# Patient Record
Sex: Female | Born: 1975 | Hispanic: Yes | Marital: Single
Health system: Northeastern US, Academic
[De-identification: ages and names within clinical notes are randomized; demographics above are authoritative.]

## PROBLEM LIST (undated history)

## (undated) DIAGNOSIS — D45 Polycythemia vera: Secondary | ICD-10-CM

## (undated) DIAGNOSIS — I10 Essential (primary) hypertension: Secondary | ICD-10-CM

## (undated) DIAGNOSIS — T7421XA Adult sexual abuse, confirmed, initial encounter: Secondary | ICD-10-CM

## (undated) DIAGNOSIS — F419 Anxiety disorder, unspecified: Secondary | ICD-10-CM

## (undated) DIAGNOSIS — K5790 Diverticulosis of intestine, part unspecified, without perforation or abscess without bleeding: Secondary | ICD-10-CM

## (undated) DIAGNOSIS — T1491XA Suicide attempt, initial encounter: Secondary | ICD-10-CM

## (undated) DIAGNOSIS — F4310 Post-traumatic stress disorder, unspecified: Secondary | ICD-10-CM

## (undated) DIAGNOSIS — C801 Malignant (primary) neoplasm, unspecified: Secondary | ICD-10-CM

## (undated) DIAGNOSIS — M797 Fibromyalgia: Secondary | ICD-10-CM

## (undated) DIAGNOSIS — G629 Polyneuropathy, unspecified: Secondary | ICD-10-CM

## (undated) DIAGNOSIS — J45909 Unspecified asthma, uncomplicated: Secondary | ICD-10-CM

## (undated) DIAGNOSIS — G8929 Other chronic pain: Secondary | ICD-10-CM

## (undated) DIAGNOSIS — M5440 Lumbago with sciatica, unspecified side: Secondary | ICD-10-CM

## (undated) DIAGNOSIS — N390 Urinary tract infection, site not specified: Secondary | ICD-10-CM

## (undated) DIAGNOSIS — M5441 Lumbago with sciatica, right side: Secondary | ICD-10-CM

## (undated) HISTORY — PX: FOOT SURGERY: SHX648

## (undated) HISTORY — PX: COLON SURGERY: SHX602

## (undated) HISTORY — PX: SPINAL FUSION: SHX223

## (undated) HISTORY — PX: HEMORROIDECTOMY: SUR656

---

## 2009-05-06 NOTE — ED Provider Notes (Signed)
[facility]                      EMERGENCY DEPARTMENT TREATMENT REPORT   PRELIMINARY (DRAFT) -- FINAL REPORT  in HPF   NAME:  Sherri Elliott, Sherri Elliott            SEX:            F   DATE:  05/06/2009                     DOB:            March 01, 1976   MR#    [id number]                       TIME SEEN        1:48 P   ACCT#  [id number]                      ROOM:           ER  ER40   cc:   The primary care physician is in [place].   TIME OF MY EVALUATION:  13:01.   CHIEF COMPLAINT:  Back pain.   HISTORY OF PRESENT ILLNESS:  This is a 33 year old female with chronic back   pain due to arthritis and scoliosis with increasing discomfort today.  Has   taken 2 Vicodin and it has not been helpful.  The pain is across her lower   midback.  It does not radiate.  There is no incontinence.  Because of   increasing pain, comes in for evaluation.   REVIEW OF SYSTEMS:   CONSTITUTIONAL:  No fever, chills, or weight loss.   GASTROINTESTINAL:  No vomiting, diarrhea, or abdominal pain.   GENITOURINARY:  No dysuria, frequency, or urgency.   MUSCULOSKELETAL:  As above.   NEUROLOGICAL:  No headaches, sensory or motor symptoms.   PAST MEDICAL HISTORY:  Asthma, chronic low back pain, scoliosis, arthritis.   FAMILY HISTORY:  Noncontributory.   SOCIAL HISTORY:  Here alone.   ALLERGIES:  Ibuprofen, aspirin, latex.   MEDICATIONS:  Multiple and reviewed in Ibex.   PHYSICAL EXAMINATION:   GENERAL:  Well-developed well-nourished female.   VITAL SIGNS:  Blood pressure is 127/84, pulse 104, respirations 16,   temperature 98.8, O2 saturation on room air is 99%.  Pain is 10/10.   HEENT:  Mouth/Throat:  Surfaces of the pharynx, palate, and tongue are   pink, moist, and without lesions.   NECK:  Supple, nontender, symmetrical, no masses or JVD, trachea midline.   Thyroid not enlarged, nodular, or tender.  No cervical or submandibular   lymphadenopathy palpated.   ABDOMEN:  Soft, nontender.    BACK:  Tender LS spine and paravertebral muscles.   NEUROLOGIC:  Alert, oriented.  Sensation intact, motor strength equal and   symmetric.  The rest are all negative.   CONTINUATION BY:  Blanchard Mane, P.A.-C   INITIAL ASSESSMENT/MANAGEMENT PLAN:  The patient with chronic back pain, a   little worse today.  Does seem to be musculoskeletal.  I do not see   evidence of infections.  No fever.   No vomiting.  Will check urine looking   for infection and also pregnancy.   DIAGNOSTIC STUDIES:  Urine was dip negative x2.  There was small blood.   Pregnancy was negative.   FINAL DIAGNOSIS:  Acute exacerbation of chronic low back pain.  DISPOSITION:  The patient is discharged home in stable condition with   instructions to follow up with their regular doctor.  They are advised to   return immediately for any worsening or symptoms of concern.  Prescription   given for Percocet 5/325, #16.  Call your doctor tomorrow for followup.   May return if new or worsening symptoms.  Return to work on May 09, 2009.  The patient was personally evaluated by myself and Dr. Stormy Card, who agrees with the above assessment and plan.   ____________________________   Stormy Card, M.D.   Dictated By:  Blanchard Mane, PA   My signature above authenticates this document and my orders, the final   diagnosis(es), discharge prescription(s) and instructions in the [facility]   [facility] record.   st  D:  05/06/2009  T:  05/10/2009  8:30 A   [id number]   st  D:  05/06/2009  T:  05/10/2009  1:40 A   [id number]

## 2010-09-22 LAB — URINALYSIS W/ RFLX MICROSCOPIC
Bilirubin: NEGATIVE
Blood: NEGATIVE
Glucose: NEGATIVE MG/DL
Ketone: NEGATIVE MG/DL
Nitrites: NEGATIVE
Protein: NEGATIVE MG/DL
Specific gravity: <1.005 (ref 1.003–1.030)
Urobilinogen: 0.2 EU/DL (ref 0.2–1.0)
pH (UA): 6 (ref 5.0–8.0)

## 2010-09-22 LAB — CBC WITH AUTOMATED DIFF
ABS. BASOPHILS: 0.1 10*3/uL — ABNORMAL HIGH (ref 0.0–0.06)
ABS. EOSINOPHILS: 0.2 10*3/uL (ref 0.0–0.4)
ABS. LYMPHOCYTES: 3.1 10*3/uL (ref 0.9–3.6)
ABS. MONOCYTES: 0.7 10*3/uL (ref 0.05–1.2)
ABS. NEUTROPHILS: 6.3 10*3/uL (ref 1.8–8.0)
BASOPHILS: 1 % (ref 0–2)
EOSINOPHILS: 2 % (ref 0–5)
HCT: 43.5 % (ref 35.0–45.0)
HGB: 14.6 g/dL (ref 12.0–16.0)
LYMPHOCYTES: 30 % (ref 21–52)
MCH: 28.3 PG (ref 24.0–34.0)
MCHC: 33.6 g/dL (ref 31.0–37.0)
MCV: 84.3 FL (ref 74.0–97.0)
MONOCYTES: 7 % (ref 3–10)
MPV: 10.2 FL (ref 9.2–11.8)
NEUTROPHILS: 60 % (ref 40–73)
PLATELET: 361 10*3/uL (ref 135–420)
RBC: 5.16 M/uL (ref 4.20–5.30)
RDW: 14.6 % — ABNORMAL HIGH (ref 11.6–14.5)
WBC: 10.4 10*3/uL (ref 4.6–13.2)

## 2010-09-22 LAB — DRUG SCREEN, URINE
AMPHETAMINES: NEGATIVE
BARBITURATES: NEGATIVE
BENZODIAZEPINES: NEGATIVE
COCAINE: POSITIVE — AB
METHADONE: NEGATIVE
OPIATES: POSITIVE — AB
PCP(PHENCYCLIDINE): NEGATIVE
THC (TH-CANNABINOL): NEGATIVE

## 2010-09-22 LAB — URINE MICROSCOPIC ONLY
Bacteria: NEGATIVE /HPF
RBC: NEGATIVE /HPF (ref 0–5)
WBC: 0 /HPF (ref 0–4)

## 2010-09-22 LAB — HCG URINE, QL: HCG urine, QL: NEGATIVE

## 2011-03-23 LAB — URINALYSIS W/ RFLX MICROSCOPIC
Bilirubin: NEGATIVE
Blood: NEGATIVE
Glucose: NEGATIVE MG/DL
Ketone: NEGATIVE MG/DL
Leukocyte Esterase: NEGATIVE
Nitrites: NEGATIVE
Protein: NEGATIVE MG/DL
Specific gravity: 1.02 (ref 1.003–1.030)
Urobilinogen: 0.2 EU/DL (ref 0.2–1.0)
pH (UA): 7 (ref 5.0–8.0)

## 2011-03-23 LAB — HCG URINE, QL: HCG urine, QL: NEGATIVE

## 2011-04-25 LAB — HCG URINE, QL: HCG urine, QL: NEGATIVE
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
## 2011-05-07 NOTE — Procedures (Signed)
Test Reason : Chest pain   Blood Pressure : ***/*** mmHG   Vent. Rate : 100 BPM     Atrial Rate : 100 BPM      P-R Int : 146 ms          QRS Dur : 082 ms       QT Int : 376 ms       P-R-T Axes : 065 067 014 degrees      QTc Int : 485 ms   Normal sinus rhythm   Prolonged QT   Abnormal ECG   No previous ECGs available   Confirmed by Adela Glimpse D. ([id number]) on 05/08/2011 12:04:54 PM   Referred By:             Gay Filler By: Burgess Estelle
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
## 2011-05-07 NOTE — Procedures (Signed)
Test Reason : Chest pain   Blood Pressure : ***/*** mmHG   Vent. Rate : 100 BPM     Atrial Rate : 100 BPM      P-R Int : 146 ms          QRS Dur : 082 ms       QT Int : 376 ms       P-R-T Axes : 065 067 014 degrees      QTc Int : 485 ms   Normal sinus rhythm   Prolonged QT   Abnormal ECG   No previous ECGs available   Confirmed by McCray, Robert D. ([id number]) on 05/08/2011 12:04:54 PM   Referred By:             Overread By: Robert D. McCray

## 2011-08-24 MED ORDER — OXYCODONE-ACETAMINOPHEN 5 MG-325 MG TAB
5-325 mg | ORAL | Status: AC
Start: 2011-08-24 — End: 2011-08-24
  Administered 2011-08-24: via ORAL

## 2011-08-24 NOTE — ED Notes (Signed)
Patient states that she fell back on steps at home 3 days ago - states has taken Tylenol for the mid to lower back pain getting worse

## 2011-08-24 NOTE — ED Provider Notes (Signed)
HPI Comments: Patient states that she slipped coming down some steps three days ago, and landed on her back.  Since then she has been taking tylenol, with very little relief.  She states that the pain is getting so bad that she feels it in her buttocks, and it is a burning, numbness, sharp type of pain. She is not having any difficulty urinating or defecating because of it, it just hurts more when she has a bowel movement.  The pain doesn't extend down her legs.  No fecal/urinary incontinece, dysuria, frequency, urgency, or other symptoms or complaints.  She has been having a little bit of sneezing the last couple of days, but no SOB, difficulty breathing, cough, sore throat, dysuria, frequency, urgency, hematuria, or other symptoms or complaints.    Patient is a 36 y.o. female presenting with back pain. The history is provided by the patient.   Back Pain   This is a new problem. The current episode started more than 2 days ago. The problem has not changed since onset.The problem occurs constantly. Patient reports no work related injury.The pain is associated with a fall. The pain is present in the lumbar spine. The quality of the pain is described as stabbing, burning and sharp. Radiates to: buttocks. The pain is at a severity of 10/10. The pain is severe. The symptoms are aggravated by bending, twisting and certain positions. The pain is the same all the time. Stiffness is present all day. Associated symptoms include numbness, perianal numbness, paresthesias and tingling. Pertinent negatives include no chest pain, no fever, no weight loss, no headaches, no abdominal pain, no abdominal swelling, no bowel incontinence, no bladder incontinence, no dysuria, no pelvic pain, no leg pain, no paresis and no weakness. Treatments tried: Tylenol. The treatment provided mild relief. Risk factors include obesity, lack of exercise and a sedentary lifestyle.        Past Medical History   Diagnosis Date   ??? Asthma         Past  Surgical History   Procedure Date   ??? Hx hemorrhoidectomy    ??? Hx bunionectomy          No family history on file.     History     Social History   ??? Marital Status: MARRIED     Spouse Name: N/A     Number of Children: N/A   ??? Years of Education: N/A     Occupational History   ??? Not on file.     Social History Main Topics   ??? Smoking status: Current Everyday Smoker -- 0.1 packs/day   ??? Smokeless tobacco: Not on file   ??? Alcohol Use: No   ??? Drug Use: No   ??? Sexually Active:      Other Topics Concern   ??? Not on file     Social History Narrative   ??? No narrative on file                  ALLERGIES: Latex; Aspirin; Banana; Codeine; Motrin; Toradol; Tramadol; and Bee venom (honey bee)      Review of Systems   Constitutional: Negative for fever, chills, weight loss, activity change and appetite change.   HENT: Positive for sneezing. Negative for hearing loss, ear pain, nosebleeds, congestion, sore throat, facial swelling, rhinorrhea, drooling, mouth sores, trouble swallowing, neck pain, neck stiffness, dental problem, voice change, postnasal drip, sinus pressure, tinnitus and ear discharge.    Respiratory: Negative for apnea, cough,  choking, chest tightness, shortness of breath, wheezing and stridor.    Cardiovascular: Negative for chest pain.   Gastrointestinal: Negative for nausea, abdominal pain, diarrhea, blood in stool and bowel incontinence.   Genitourinary: Negative for bladder incontinence, dysuria, urgency, frequency, hematuria, flank pain, decreased urine volume, difficulty urinating and pelvic pain.   Musculoskeletal: Positive for back pain.   Skin: Negative for color change and rash.   Neurological: Positive for tingling, numbness and paresthesias. Negative for dizziness, weakness, light-headedness and headaches.   Hematological: Negative for adenopathy.       Filed Vitals:    08/24/11 1723   BP: 131/91   Pulse: 122   Temp: 99.1 ??F (37.3 ??C)   Resp: 17   SpO2: 100%            Physical Exam   Nursing note and  vitals reviewed.  Constitutional: She is oriented to person, place, and time. She appears well-developed and well-nourished.   HENT:   Head: Normocephalic and atraumatic.   Right Ear: External ear normal.   Left Ear: External ear normal.   Nose: Nose normal.   Mouth/Throat: Oropharynx is clear and moist. No oropharyngeal exudate.   Eyes: Conjunctivae and EOM are normal. Pupils are equal, round, and reactive to light. Right eye exhibits no discharge. Left eye exhibits no discharge.   Neck: Normal range of motion. Neck supple. No JVD present. No tracheal deviation present.   Cardiovascular: Normal rate, regular rhythm, normal heart sounds and intact distal pulses.  Exam reveals no gallop and no friction rub.    No murmur heard.  Pulmonary/Chest: Effort normal and breath sounds normal. No stridor. No respiratory distress. She has no wheezes. She has no rales.   Abdominal: Soft. Bowel sounds are normal. She exhibits no distension. There is no tenderness.   Musculoskeletal: She exhibits tenderness. She exhibits no edema.        L1-L5 is TTP with mild Paraspinous TTP bilatearlly.  No other back TTP.  Limited ROM to the lumbar spine due to increased pain with movement.  Neg SLR, toe/heel lift, axial loading. +Psuedo rotation, and +light touch.   Lymphadenopathy:     She has no cervical adenopathy.   Neurological: She is alert and oriented to person, place, and time. No cranial nerve deficit. She exhibits normal muscle tone.   Skin: Skin is warm and dry.   Psychiatric: She has a normal mood and affect. Her behavior is normal.        MDM     Differential Diagnosis; Clinical Impression; Plan:     Back pain, back strain, back contusion, vertebral fracture.  Amount and/or Complexity of Data Reviewed:   Tests in the radiology section of CPT??:  Ordered and reviewed   Discuss the patient with another provider:  Yes (Discussed care with Dr. Garnet Koyanagi.  I was concerned about elevated pulse.  He advised that if her pulse decreases when  her pain decreases then we can discharge her home, but to advise her to return if pain worsens, or if her heart starts to race. )  Risk of Significant Complications, Morbidity, and/or Mortality:   Presenting problems:  Low  Diagnostic procedures:  Low  Management options:  Low  General Comments: Mild pain improvement, pulse is improving also.  Advised patient to return if pain worsens.  Progress:   Patient progress:  Stable and improved      Procedures    7:47 PM  Reviewed treatment, medication, lab results, x-ray results, and  follow up plan with patient. Answered patient's questions.  Patient is ready for discharge.    A Lumbar spine series X-Ray was ordered. My reading of this film is No acute process, no fracture. (No comparison films available: pending review by Radiologist.)    Recent Results (from the past 12 hour(s))   URINALYSIS W/ RFLX MICROSCOPIC    Collection Time    08/24/11  6:45 PM       Component Value Range    Color AMBER      Appearance CLEAR      Specific gravity 1.015  1.003 - 1.030      pH 7.0  5.0 - 8.0      Protein NEGATIVE   NEGATIVE MG/DL    Glucose NEGATIVE   NEGATIVE MG/DL    Ketone NEGATIVE   NEGATIVE MG/DL    Bilirubin NEGATIVE   NEGATIVE    Blood NEGATIVE   NEGATIVE    Urobilinogen 0.2  0.2 - 1.0 EU/DL    Nitrites NEGATIVE   NEGATIVE    Leukocyte Esterase NEGATIVE   NEGATIVE     Diagnosis:   1. Low back pain          Disposition: Home    Follow-up Information     Follow up With Details Comments Contact Info    R Vivia Birmingham, MD in 2 weeks If no improvement [address]  [address]  [place] [address]  [phone]      [facility] EMERGENCY DEPT  If symptoms worsen [phone]          Patient's Medications   Start Taking    CYCLOBENZAPRINE (FLEXERIL) 10 MG TABLET    Take 1 Tab by mouth three (3) times daily as needed for Muscle Spasm(s).    OXYCODONE-ACETAMINOPHEN (PERCOCET) 5-325 MG PER TABLET    Take 1 Tab by mouth every four (4) hours as needed for Pain for 7 days.   Continue Taking     ALBUTEROL (PROVENTIL HFA, VENTOLIN HFA) 90 MCG/ACTUATION INHALER    Take 2 Puffs by inhalation every four (4) hours as needed.     These Medications have changed    No medications on file   Stop Taking    No medications on file

## 2011-08-24 NOTE — ED Provider Notes (Signed)
I was personally available for consultation in the emergency department.  I have not seen the patient but have reviewed the chart and agree with the documentation recorded by the MLP, including the assessment, treatment plan and disposition.  Mylz Yuan, MD

## 2011-08-25 LAB — URINALYSIS W/ RFLX MICROSCOPIC
Bilirubin: NEGATIVE
Blood: NEGATIVE
Glucose: NEGATIVE MG/DL
Ketone: NEGATIVE MG/DL
Leukocyte Esterase: NEGATIVE
Nitrites: NEGATIVE
Protein: NEGATIVE MG/DL
Specific gravity: 1.015 (ref 1.003–1.030)
Urobilinogen: 0.2 EU/DL (ref 0.2–1.0)
pH (UA): 7 (ref 5.0–8.0)

## 2011-08-25 MED ORDER — CYCLOBENZAPRINE 10 MG TAB
10 mg | ORAL | Status: AC
Start: 2011-08-25 — End: 2011-08-24
  Administered 2011-08-25: 02:00:00 via ORAL

## 2011-08-25 MED ORDER — CYCLOBENZAPRINE 10 MG TAB
10 mg | ORAL_TABLET | Freq: Three times a day (TID) | ORAL | Status: DC | PRN
Start: 2011-08-25 — End: 2012-07-08

## 2011-08-25 MED ORDER — OXYCODONE-ACETAMINOPHEN 5 MG-325 MG TAB
5-325 mg | ORAL_TABLET | ORAL | Status: AC | PRN
Start: 2011-08-25 — End: 2011-08-31

## 2011-08-25 MED FILL — OXYCODONE-ACETAMINOPHEN 5 MG-325 MG TAB: 5-325 mg | ORAL | Qty: 1
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
## 2011-09-08 NOTE — ED Provider Notes (Signed)
I was personally available for consultation in the emergency department.  I have reviewed the chart and agree with the documentation recorded by the MLP, including the assessment, treatment plan, and disposition.  Dusty Raczkowski J Benjermin Korber, MD

## 2011-09-08 NOTE — Other (Signed)
I have reviewed discharge instructions with the patient.  The patient verbalized understanding.

## 2011-12-17 NOTE — ED Notes (Signed)
Severe back pain for the last 2-3 days, pain is worst in lower back

## 2011-12-17 NOTE — ED Provider Notes (Signed)
I was personally available for consultation in the emergency department.  I have not seen the patient but have reviewed the chart prior to discharge and agree with the documentation recorded by the MLP, including the assessment, treatment plan, and disposition.  Shareka Casale, DO
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
## 2011-12-17 NOTE — ED Notes (Signed)
Patient left without dc instructions

## 2012-03-12 NOTE — ED Notes (Signed)
Patient with c/o lower abdominal cramping worsening over x 7 days.

## 2012-03-13 LAB — URINALYSIS W/ RFLX MICROSCOPIC
Bilirubin: NEGATIVE
Blood: NEGATIVE
Glucose: NEGATIVE MG/DL
Ketone: NEGATIVE MG/DL
Leukocyte Esterase: NEGATIVE
Nitrites: NEGATIVE
Protein: NEGATIVE MG/DL
Specific gravity: 1.025 (ref 1.003–1.030)
Urobilinogen: 1 EU/DL (ref 0.2–1.0)
pH (UA): 6.5 (ref 5.0–8.0)

## 2012-03-13 LAB — CBC WITH AUTOMATED DIFF
ABS. BASOPHILS: 0 10*3/uL (ref 0.0–0.06)
ABS. EOSINOPHILS: 0.3 10*3/uL (ref 0.0–0.4)
ABS. LYMPHOCYTES: 5.3 10*3/uL — ABNORMAL HIGH (ref 0.9–3.6)
ABS. MONOCYTES: 1.1 10*3/uL (ref 0.05–1.2)
ABS. NEUTROPHILS: 6.9 10*3/uL (ref 1.8–8.0)
BASOPHILS: 0 % (ref 0–2)
EOSINOPHILS: 2 % (ref 0–5)
HCT: 41.1 % (ref 35.0–45.0)
HGB: 14.1 g/dL (ref 12.0–16.0)
LYMPHOCYTES: 39 % (ref 21–52)
MCH: 27.9 PG (ref 24.0–34.0)
MCHC: 34.3 g/dL (ref 31.0–37.0)
MCV: 81.4 FL (ref 74.0–97.0)
MONOCYTES: 8 % (ref 3–10)
MPV: 9.8 FL (ref 9.2–11.8)
NEUTROPHILS: 51 % (ref 40–73)
PLATELET: 376 10*3/uL (ref 135–420)
RBC: 5.05 M/uL (ref 4.20–5.30)
RDW: 13.9 % (ref 11.6–14.5)
WBC: 13.6 10*3/uL — ABNORMAL HIGH (ref 4.6–13.2)

## 2012-03-13 LAB — METABOLIC PANEL, BASIC
Anion gap: 12 mmol/L (ref 3.0–18)
BUN/Creatinine ratio: 17 (ref 12–20)
BUN: 12 MG/DL (ref 7.0–18)
CO2: 24 MMOL/L (ref 21–32)
Calcium: 8.8 MG/DL (ref 8.5–10.1)
Chloride: 107 MMOL/L (ref 100–108)
Creatinine: 0.7 MG/DL (ref 0.6–1.3)
GFR est AA: >60 mL/min/{1.73_m2} (ref >60)
GFR est non-AA: >60 mL/min/{1.73_m2} (ref >60)
Glucose: 90 MG/DL (ref 74–99)
Potassium: 3.3 MMOL/L — ABNORMAL LOW (ref 3.5–5.5)
Sodium: 143 MMOL/L (ref 136–145)

## 2012-03-13 LAB — HCG URINE, QL: HCG urine, QL: NEGATIVE

## 2012-03-13 MED ORDER — POTASSIUM CHLORIDE SR 20 MEQ TAB, PARTICLES/CRYSTALS
20 mEq | ORAL | Status: AC
Start: 2012-03-13 — End: 2012-03-13
  Administered 2012-03-13: 06:00:00 via ORAL

## 2012-03-13 MED ORDER — OXYCODONE-ACETAMINOPHEN 5 MG-325 MG TAB
5-325 mg | ORAL_TABLET | ORAL | Status: DC | PRN
Start: 2012-03-13 — End: 2012-07-08

## 2012-03-13 MED ORDER — MORPHINE 4 MG/ML SYRINGE
4 mg/mL | INTRAMUSCULAR | Status: AC
Start: 2012-03-13 — End: 2012-03-13
  Administered 2012-03-13: 05:00:00 via INTRAVENOUS

## 2012-03-13 MED FILL — KLOR-CON M20 MEQ TABLET,EXTENDED RELEASE: 20 mEq | ORAL | Qty: 1

## 2012-03-13 MED FILL — MORPHINE 4 MG/ML SYRINGE: 4 mg/mL | INTRAMUSCULAR | Qty: 1

## 2012-03-13 NOTE — ED Notes (Signed)
I have reviewed discharge instructions with the patient.  The patient verbalized understanding.

## 2012-03-13 NOTE — ED Provider Notes (Signed)
I was personally available for consultation in the emergency department.  I have reviewed the chart and agree with the documentation recorded by the MLP, including the assessment, treatment plan, and disposition.  Hiyab Nhem C. Bernice Mullin, DO

## 2012-03-13 NOTE — ED Notes (Signed)
Resting quietly with eyes close with friend at the bedside.Marland Kitchen

## 2012-03-13 NOTE — ED Provider Notes (Signed)
HPI Comments: This is a 36 y.o. female who complains of cramping pelvic pain and bleeding that started 1 week ago after having a colposcopy done by her OBGYN in [place]. She attempted to get another appointment, but was told that she couldn't be seen again for 1 month. She denies any fever, chills, nausea, vomiting, diarrhea, dysuria. She had the colposcopy done for abnormal pap smears and HPV.                Past Medical History   Diagnosis Date   ??? Asthma         Past Surgical History   Procedure Date   ??? Hx hemorrhoidectomy    ??? Hx bunionectomy    ??? Hx orthopaedic      bunionectomy   ??? Hx gi      hemorroidectomy   ??? Hx colposcopy          History reviewed. No pertinent family history.     History     Social History   ??? Marital Status: SINGLE     Spouse Name: N/A     Number of Children: N/A   ??? Years of Education: N/A     Occupational History   ??? Not on file.     Social History Main Topics   ??? Smoking status: Current Everyday Smoker -- 0.1 packs/day   ??? Smokeless tobacco: Not on file   ??? Alcohol Use: No   ??? Drug Use: No   ??? Sexually Active: Not Currently     Other Topics Concern   ??? Not on file     Social History Narrative   ??? No narrative on file                  ALLERGIES: Latex; Aspirin; Banana; Codeine; Motrin; Toradol; Tramadol; and Bee venom (honey bee)      Review of Systems   Respiratory: Negative for shortness of breath.    Cardiovascular: Negative for chest pain.   Gastrointestinal: Positive for abdominal pain. Negative for nausea, vomiting and diarrhea.   Genitourinary: Positive for vaginal bleeding and pelvic pain. Negative for dysuria and frequency.       Filed Vitals:    03/12/12 2306   BP: 124/88   Pulse: 84   Temp: 98.1 ??F (36.7 ??C)   Resp: 16   Height: 5\' 3"  (1.6 m)   Weight: 72.576 kg (160 lb)   SpO2: 100%            Physical Exam     CONSTITUTIONAL: Well-appearing; well-nourished; in no apparent distress.  HEAD: Normocephalic; atraumatic.  EYES: PERRL; EOM intact.  ENT: TM's normal; normal nose;  no rhinorrhea; normal pharynx with no tonsillar hypertrophy.  NECK: Supple; non-tender; no cervical lymphadenopathy.  No JVD.  CHEST:  Normal chest excursion with respiration.  CARDIOVASCULAR: Normal S1, S2; no murmurs, rubs, or gallops.  RESPIRATORY: Normal chest excursion with respiration; breath sounds clear and equal bilaterally; no wheezes, rhonchi, or rales.  GI: Normal bowel sounds; non-distended; + mild suprapubic abdominal tenderness to palpation; no palpable organomegaly.  BACK:  No evidence of trauma, pain or deformity.  Non-tender to palpation.  No scoliosis.  No CVA tenderness.  PELVIS:  No evidence of trauma or deformity.  EXT: Normal ROM in all four extremities; non-tender to palpation; distal pulses are normal.  SKIN: Normal for age and race; warm; dry; good turgor; no apparent lesions or exudate.  NEURO: A & O x 4; grossly unremarkable.  Cranial nerves normal.  DTR equal and reactive bilaterally.  Motor 5/5 bilaterally. Sensory grossly normal bilaterally.  Gait normal.   PSYCHOLOGICAL:  The patients mood and manner are appropriate.  Grooming and personal hygiene are appropriate.  No apparent thoughts of harm to self or others.      MDM     Differential Diagnosis; Clinical Impression; Plan:     Impression: Vaginal bleeding with cramping pain.   Amount and/or Complexity of Data Reviewed:   Clinical lab tests:  Ordered and reviewed      Procedures    Speculum exam performed. No bleeding seen from cervix. Cervical biopsy site appears to be without infections. Small amount of dark red blood in vaginal vault. Patient tolerated well.      Diagnosis:   1. Vaginal bleeding          Disposition: Home    Follow-up Information     Follow up With Details Comments Contact Info    Your OBGYN  in 2-4 days for re-evaluation           Patient's Medications   Start Taking    OXYCODONE-ACETAMINOPHEN (PERCOCET) 5-325 MG PER TABLET    Take 1 Tab by mouth every four (4) hours as needed for Pain.   Continue Taking     ALBUTEROL (PROVENTIL HFA, VENTOLIN HFA) 90 MCG/ACTUATION INHALER    Take 2 Puffs by inhalation every four (4) hours as needed.      CYCLOBENZAPRINE (FLEXERIL) 10 MG TABLET    Take 1 Tab by mouth three (3) times daily as needed for Muscle Spasm(s).   These Medications have changed    No medications on file   Stop Taking    No medications on file
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
## 2012-07-09 LAB — EKG, 12 LEAD, INITIAL
Atrial Rate: 81 {beats}/min
Calculated P Axis: 52 degrees
Calculated R Axis: 30 degrees
Calculated T Axis: 32 degrees
Diagnosis: NORMAL
P-R Interval: 156 ms
Q-T Interval: 398 ms
QRS Duration: 80 ms
QTC Calculation (Bezet): 462 ms
Ventricular Rate: 81 {beats}/min
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
## 2013-05-07 NOTE — ED Provider Notes (Addendum)
HPI Comments: 37 year old female to the fast track with a c/o dysuria.  Pt states she went to [place] last week wit the same complaint.  Was treated for a UTI with Amoxicillin.  States she still feels the same.  Katherine Roan, NP  8:33 PM        Patient is a 37 y.o. female presenting with abdominal pain and frequency. The history is provided by the patient. History limited by: No communication barrer.   Abdominal Pain   This is a new problem. The current episode started more than 1 week ago. The problem occurs constantly. The pain is located in the suprapubic region. The quality of the pain is aching. Associated symptoms include frequency.   Urinary Frequency   Associated symptoms include frequency and abdominal pain.        Past Medical History   Diagnosis Date   ??? Asthma    ??? Musculoskeletal disorder      Chronic back pain        Past Surgical History   Procedure Laterality Date   ??? Hx hemorrhoidectomy     ??? Hx bunionectomy     ??? Hx orthopaedic       bunionectomy   ??? Hx gi       hemorroidectomy   ??? Hx colposcopy           History reviewed. No pertinent family history.     History     Social History   ??? Marital Status: SINGLE     Spouse Name: N/A     Number of Children: N/A   ??? Years of Education: N/A     Occupational History   ??? Not on file.     Social History Main Topics   ??? Smoking status: Current Every Day Smoker -- 0.50 packs/day   ??? Smokeless tobacco: Not on file   ??? Alcohol Use: No   ??? Drug Use: No   ??? Sexually Active: Not Currently     Other Topics Concern   ??? Not on file     Social History Narrative   ??? No narrative on file                  ALLERGIES: Latex; Aspirin; Banana; Codeine; Motrin; Toradol; Tramadol; Bee venom (honey bee); and Tape      Review of Systems   Constitutional: Negative.    HENT: Negative.    Eyes: Negative.    Respiratory: Negative.    Cardiovascular: Negative.    Gastrointestinal: Positive for abdominal pain.   Genitourinary: Positive for frequency.   Musculoskeletal: Negative.     Skin: Negative.    Allergic/Immunologic: Negative.    Neurological: Negative.    Psychiatric/Behavioral: Negative.        Filed Vitals:    05/07/13 2016   BP: 130/91   Pulse: 91   Temp: 99.1 ??F (37.3 ??C)   Resp: 16   Height: 5\' 3"  (1.6 m)   Weight: 74.844 kg (165 lb)   SpO2: 100%            Physical Exam   Nursing note and vitals reviewed.  Constitutional: She is oriented to person, place, and time. She appears well-developed and well-nourished. No distress.   HENT:   Head: Normocephalic and atraumatic.   Eyes: EOM are normal. Pupils are equal, round, and reactive to light.   Neck: Normal range of motion. Neck supple.   Cardiovascular: Normal rate, regular rhythm and normal heart  sounds.    Pulmonary/Chest: No respiratory distress. She has no wheezes. She has no rales.   Abdominal: Soft. Bowel sounds are normal. There is no tenderness.   Genitourinary:   NE   Musculoskeletal: Normal range of motion.   Neurological: She is alert and oriented to person, place, and time.   Skin: Skin is warm and dry.   Psychiatric: She has a normal mood and affect.        MDM     Differential Diagnosis; Clinical Impression; Plan:     MDM:  Pt UA did not reflect infection onUA, but ABX given to symptomatic dysuria.  Katherine Roan, NP  9:06 PM            Procedures    PROGRESS NOTE:  One or more blood pressure readings were noted elevated during the Pt's presentation in the emergency department this date.  This abnormal reading has been cited in the Pt's diagnosis, and they have been encouraged to follow up with their primary care physician, or referred to a consultant for further evaluation and treatment.     Katherine Roan, NP   9:07 PM    Diagnosis:   1. Dysuria    2. Elevated blood pressure          Disposition:   Discharged to Home.      Follow-up Information    Follow up With Details Comments Contact Info    Aerian Dondra Spry, NP Call for 48 hours follow up.   [address]  [place] [address]  [phone]            Current Discharge  Medication List      START taking these medications    Details   trimethoprim-sulfamethoxazole (SEPTRA DS) 160-800 mg per tablet Take 1 tablet by mouth two (2) times a day for 5 days.  Qty: 10 tablet, Refills: 0      phenazopyridine (PYRIDIUM) 200 mg tablet Take 1 tablet by mouth two (2) times a day for 2 days.  Qty: 4 tablet, Refills: 0         CONTINUE these medications which have NOT CHANGED    Details   escitalopram oxalate (LEXAPRO) 20 mg tablet Take 20 mg by mouth daily.      oxycodone-acetaminophen (PERCOCET) 5-325 mg per tablet Take 1 tablet by mouth every four (4) hours as needed for Pain.      fentaNYL (DURAGESIC) 50 mcg/hr PATCH 1 Patch by TransDERmal route every seventy-two (72) hours.      lisinopril (PRINIVIL, ZESTRIL) 5 mg tablet Take 5 mg by mouth daily.      budesonide (PULMICORT) 180 mcg/actuation aepb inhaler Take 2 Puffs by inhalation.      albuterol (PROVENTIL HFA, VENTOLIN HFA) 90 mcg/actuation inhaler Take 2 Puffs by inhalation every four (4) hours as needed.        morphine IR (MS IR) 15 mg tablet Take 15 mg by mouth every four (4) hours as needed for Pain.      rOPINIRole (REQUIP) 1 mg tablet Take 1 mg by mouth nightly.      ARIPiprazole (ABILIFY) 30 mg tablet Take 30 mg by mouth daily.      mirtazapine (REMERON) 45 mg tablet Take 45 mg by mouth nightly.      prochlorperazine (COMPAZINE) 5 mg tablet Take 5 mg by mouth every six (6) hours as needed for Nausea.         I was personally available for consultation in  the emergency department.  I have not seen the patient but have reviewed the chart prior to discharge and agree with the documentation recorded by the [facility], including the assessment, treatment plan, and disposition.  Rogelia Boga, MD          .
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
## 2013-05-07 NOTE — ED Notes (Signed)
Assuming care of this patient. Patient states she has had UTI for past couple of weeks. Took antibiotics for it but thinks it didn't take it away. Still having urinary frequency, more at night and dysuria. Also c/o intense mid back pain

## 2013-05-07 NOTE — ED Notes (Signed)
"  I am having a lot of pain in my back and I have been using the bathroom every couple of minutes, at night it is only like a little bit that comes out, it feels like pressure in my bladder,  I am taking amoxacillin for a UTI and it doesn't seem to be working"

## 2013-05-08 LAB — URINALYSIS W/ RFLX MICROSCOPIC
Blood: NEGATIVE
Glucose: NEGATIVE mg/dL
Leukocyte Esterase: NEGATIVE
Nitrites: NEGATIVE
Protein: NEGATIVE mg/dL
Specific gravity: >1.03 — ABNORMAL HIGH (ref 1.003–1.030)
Urobilinogen: 0.2 EU/dL (ref 0.2–1.0)
pH (UA): 6 (ref 5.0–8.0)

## 2013-05-08 LAB — HCG URINE, QL: HCG urine, QL: NEGATIVE

## 2013-05-08 MED ORDER — TRIMETHOPRIM-SULFAMETHOXAZOLE 160 MG-800 MG TAB
160-800 mg | Freq: Two times a day (BID) | ORAL | Status: DC
Start: 2013-05-08 — End: 2013-05-08
  Administered 2013-05-08: 01:00:00 via ORAL

## 2013-05-08 MED ORDER — TRIMETHOPRIM-SULFAMETHOXAZOLE 160 MG-800 MG TAB
160-800 mg | ORAL_TABLET | Freq: Two times a day (BID) | ORAL | Status: AC
Start: 2013-05-08 — End: 2013-05-12

## 2013-05-08 MED ORDER — PHENAZOPYRIDINE 200 MG TAB
200 mg | ORAL_TABLET | Freq: Two times a day (BID) | ORAL | Status: AC
Start: 2013-05-08 — End: 2013-05-09

## 2013-05-08 MED ORDER — PHENAZOPYRIDINE 100 MG TAB
100 mg | ORAL | Status: AC
Start: 2013-05-08 — End: 2013-05-07
  Administered 2013-05-08: 01:00:00 via ORAL

## 2015-08-22 ENCOUNTER — Inpatient Hospital Stay
Admit: 2015-08-22 | Discharge: 2015-08-23 | Disposition: A | Discharge status: Home / Self Care | Payer: MEDICARE | Attending: Emergency Medicine

## 2015-08-22 DIAGNOSIS — F1123 Opioid dependence with withdrawal: Secondary | ICD-10-CM

## 2015-08-22 NOTE — ED Provider Notes (Signed)
HPI Comments: 7:21 PM  Sherri Elliott is a 40 y.o. female who presents to the ED with complaints of withdraw. Pt states she ran out of Percocet, Fentenyl, and Xanax. Pt experiences tremors, diaphoresis, nausea, and CP. PCP cancelled today due to weather. LMP was in May 2016 due to POTS. No further symptoms or complaints expressed at this time.      The history is provided by the patient.        Past Medical History:   Diagnosis Date   ??? Asthma    ??? Musculoskeletal disorder      Chronic back pain       Past Surgical History:   Procedure Laterality Date   ??? Hx hemorrhoidectomy     ??? Hx bunionectomy     ??? Hx orthopaedic       bunionectomy   ??? Hx gi       hemorroidectomy   ??? Hx colposcopy           History reviewed. No pertinent family history.    Social History     Social History   ??? Marital status: SINGLE     Spouse name: N/A   ??? Number of children: N/A   ??? Years of education: N/A     Occupational History   ??? Not on file.     Social History Main Topics   ??? Smoking status: Current Every Day Smoker     Packs/day: 0.25   ??? Smokeless tobacco: Not on file   ??? Alcohol use No   ??? Drug use: No   ??? Sexual activity: Not Currently     Other Topics Concern   ??? Not on file     Social History Narrative         ALLERGIES: Latex; Aspirin; Banana; Codeine; Motrin [ibuprofen]; Toradol [ketorolac tromethamine]; Tramadol; Tape [adhesive]; and Venom-honey bee    Review of Systems   Constitutional: Positive for chills and diaphoresis. Negative for fever.   HENT: Negative for sore throat.    Eyes: Negative for redness.   Respiratory: Negative for shortness of breath and wheezing.    Cardiovascular: Positive for chest pain.   Gastrointestinal: Positive for nausea. Negative for abdominal pain.   Genitourinary: Negative for dysuria.   Musculoskeletal: Negative for neck stiffness.   Skin: Negative for pallor.   Neurological: Negative for headaches.   Hematological: Does not bruise/bleed easily.    All other systems reviewed and are negative.      Vitals:    08/22/15 1754   BP: 142/90   Pulse: 94   Resp: 14   Temp: 98.6 ??F (37 ??C)   SpO2: 100%   Weight: 86.2 kg (190 lb)   Height: 5\' 3"  (1.6 m)            Physical Exam   Constitutional: She is oriented to person, place, and time. She appears well-developed and well-nourished. No distress.   HENT:   Head: Normocephalic and atraumatic.   Mouth/Throat: Oropharynx is clear and moist.   Eyes: Conjunctivae are normal. Pupils are equal, round, and reactive to light. No scleral icterus.   Neck: Normal range of motion. Neck supple.   Cardiovascular: Intact distal pulses.    Capillary refill < 3 seconds   Pulmonary/Chest: Effort normal and breath sounds normal. No respiratory distress. She has no wheezes.   Abdominal: Soft. Bowel sounds are normal. She exhibits no distension. There is no tenderness.   Musculoskeletal: Normal range of motion. She  exhibits no edema.   Lymphadenopathy:     She has no cervical adenopathy.   Neurological: She is alert and oriented to person, place, and time. No cranial nerve deficit.   Skin: Skin is warm and dry. She is not diaphoretic.   Nursing note and vitals reviewed.       MDM  ED Course       Procedures    PROGRESS NOTES  7:20 PM: Ewing Schlein, DO arrives to the bedside to evaluate the patient. Answered the patient's questions regarding the treatment plan.      CONSULTATIONS        ED PHYSICIAN ORDERS  Orders Placed This Encounter   ??? EKG, 12 LEAD, INITIAL     Standing Status:   Standing     Number of Occurrences:   1     Order Specific Question:   Reason for Exam:     Answer:   chest pain, withdrawals   ??? carvedilol (COREG) 6.25 mg tablet     Sig: Take 6.25 mg by mouth two (2) times daily (with meals).   ??? ALPRAZolam (XANAX) 2 mg tablet     Sig: Take 2 mg by mouth.   ??? dicyclomine (BENTYL) capsule 20 mg   ??? cloNIDine HCl (CATAPRES) tablet 0.1 mg   ??? acetaminophen (TYLENOL) tablet 650 mg   ??? cloNIDine HCl (CATAPRES) 0.1 mg tablet      Sig: Take 1 Tab by mouth daily as needed. Indications: OPIOID WITHDRAWAL SYMPTOMS     Dispense:  5 Tab     Refill:  0   ??? dicyclomine (BENTYL) 10 mg capsule     Sig: Take 1 Cap by mouth two (2) times daily as needed (abd cramps) for up to 5 days.     Dispense:  8 Cap     Refill:  0           MEDICATIONS ORDERED  Medications   dicyclomine (BENTYL) capsule 20 mg (20 mg Oral Given 08/22/15 1925)   cloNIDine HCl (CATAPRES) tablet 0.1 mg (0.1 mg Oral Given 08/22/15 1925)   acetaminophen (TYLENOL) tablet 650 mg (650 mg Oral Given 08/22/15 1925)         Vitals:  Patient Vitals for the past 12 hrs:   Temp Pulse Resp BP SpO2   08/22/15 1754 98.6 ??F (37 ??C) 94 14 142/90 100 %           RADIOLOGY INTERPRETATIONS  No orders to display           LABORATORY RESULTS  Labs Reviewed - No data to display          EKG interpretation by ED Dr. Kristeen Mans        ED DIAGNOSIS & DISPOSITION INFORMATION  Diagnosis:   1. Opiate withdrawal (HCC)          Disposition: discharged    Follow-up Information     Follow up With Details Comments Contact Info    Abelino Derrick, MD Call in 1 day  [address]  [place] [address]  [phone]      [facility] EMERGENCY DEPT  As needed, If symptoms worsen [address]  [place] [address]  [phone]          Patient's Medications   Start Taking    CLONIDINE HCL (CATAPRES) 0.1 MG TABLET    Take 1 Tab by mouth daily as needed. Indications: OPIOID WITHDRAWAL SYMPTOMS  DICYCLOMINE (BENTYL) 10 MG CAPSULE    Take 1 Cap by mouth two (2) times daily as needed (abd cramps) for up to 5 days.   Continue Taking    ALBUTEROL (PROVENTIL HFA, VENTOLIN HFA) 90 MCG/ACTUATION INHALER    Take 2 Puffs by inhalation every four (4) hours as needed.      ALPRAZOLAM (XANAX) 2 MG TABLET    Take 2 mg by mouth.    ARIPIPRAZOLE (ABILIFY) 30 MG TABLET    Take 30 mg by mouth daily.    BUDESONIDE (PULMICORT) 180 MCG/ACTUATION AEPB INHALER    Take 2 Puffs by inhalation.     CARVEDILOL (COREG) 6.25 MG TABLET    Take 6.25 mg by mouth two (2) times daily (with meals).    ESCITALOPRAM OXALATE (LEXAPRO) 20 MG TABLET    Take 20 mg by mouth daily.    FENTANYL (DURAGESIC) 50 MCG/HR PATCH    1 Patch by TransDERmal route every seventy-two (72) hours.    LISINOPRIL (PRINIVIL, ZESTRIL) 5 MG TABLET    Take 5 mg by mouth daily.    MIRTAZAPINE (REMERON) 45 MG TABLET    Take 45 mg by mouth nightly.    MORPHINE IR (MS IR) 15 MG TABLET    Take 15 mg by mouth every four (4) hours as needed for Pain.    OXYCODONE-ACETAMINOPHEN (PERCOCET) 5-325 MG PER TABLET    Take 1 tablet by mouth every four (4) hours as needed for Pain.    PROCHLORPERAZINE (COMPAZINE) 5 MG TABLET    Take 5 mg by mouth every six (6) hours as needed for Nausea.    ROPINIROLE (REQUIP) 1 MG TABLET    Take 1 mg by mouth nightly.   These Medications have changed    No medications on file   Stop Taking    No medications on file       ATTESTATION STATEMENT  Scribe Attestation:   I, Fermin Schwab, scribing for and in the presence of Angelena Sole, DO August 22, 2015 at 7:24 PM     Physician Attestation:   I personally performed the services described in this documentation, reviewed and edited the documentation which was dictated to the scribe in my presence, and it accurately records my words and actions. Angelena Sole, DO August 22, 2015 at 7:24 PM     Signed by: Fermin Schwab, Scribe. 7:21 PM

## 2015-08-22 NOTE — ED Notes (Signed)
I have reviewed discharge instructions with the patient.  The patient verbalized understanding.    Patient armband removed and shredded

## 2015-08-22 NOTE — ED Notes (Signed)
Patient called back in to triage to speak with Dr. Kristeen Mansews concerning her medications, allergies, and for Dr. Kristeen Mansews to reevaluate.     Dr. Kristeen Mansews ordering medications for patient
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
## 2015-08-23 LAB — EKG, 12 LEAD, INITIAL
Atrial Rate: 93 {beats}/min
Calculated P Axis: 18 degrees
Calculated R Axis: 22 degrees
Calculated T Axis: 14 degrees
Diagnosis: NORMAL
P-R Interval: 156 ms
Q-T Interval: 368 ms
QRS Duration: 84 ms
QTC Calculation (Bezet): 457 ms
Ventricular Rate: 93 {beats}/min

## 2015-08-23 MED ORDER — CLONIDINE 0.1 MG TAB
0.1 mg | ORAL | Status: AC
Start: 2015-08-23 — End: 2015-08-22
  Administered 2015-08-23: via ORAL

## 2015-08-23 MED ORDER — ACETAMINOPHEN 325 MG TABLET
325 mg | Freq: Once | ORAL | Status: AC
Start: 2015-08-23 — End: 2015-08-22
  Administered 2015-08-23: via ORAL

## 2015-08-23 MED ORDER — CLONIDINE 0.1 MG TAB
0.1 mg | ORAL_TABLET | Freq: Every day | ORAL | 0 refills | Status: DC | PRN
Start: 2015-08-23 — End: 2016-02-28

## 2015-08-23 MED ORDER — DICYCLOMINE 10 MG CAP
10 mg | ORAL | Status: AC
Start: 2015-08-23 — End: 2015-08-22
  Administered 2015-08-23: via ORAL

## 2015-08-23 MED ORDER — DICYCLOMINE 10 MG CAP
10 mg | ORAL_CAPSULE | Freq: Two times a day (BID) | ORAL | 0 refills | Status: DC | PRN
Start: 2015-08-23 — End: 2015-08-25

## 2015-08-23 MED FILL — DICYCLOMINE 10 MG CAP: 10 mg | ORAL | Qty: 2

## 2015-08-23 MED FILL — CLONIDINE 0.1 MG TAB: 0.1 mg | ORAL | Qty: 1

## 2015-08-23 MED FILL — TYLENOL 325 MG TABLET: 325 mg | ORAL | Qty: 2

## 2015-08-25 ENCOUNTER — Encounter

## 2015-08-25 ENCOUNTER — Ambulatory Visit: Admit: 2015-08-25 | Discharge: 2015-08-25 | Payer: MEDICARE | Attending: Internal Medicine

## 2015-08-25 ENCOUNTER — Inpatient Hospital Stay: Admit: 2015-08-25 | Payer: MEDICARE

## 2015-08-25 DIAGNOSIS — I10 Essential (primary) hypertension: Secondary | ICD-10-CM

## 2015-08-25 LAB — LIPID PANEL
CHOL/HDL Ratio: 4 (ref 0–5.0)
Cholesterol, total: 186 MG/DL (ref <200)
HDL Cholesterol: 47 MG/DL (ref 40–60)
LDL, calculated: 111.2 MG/DL — ABNORMAL HIGH (ref 0–100)
Triglyceride: 139 MG/DL (ref <150)
VLDL, calculated: 27.8 MG/DL

## 2015-08-25 LAB — METABOLIC PANEL, COMPREHENSIVE
A-G Ratio: 1.1 (ref 0.8–1.7)
ALT (SGPT): 90 U/L — ABNORMAL HIGH (ref 13–56)
AST (SGOT): 54 U/L — ABNORMAL HIGH (ref 15–37)
Albumin: 4.7 g/dL (ref 3.4–5.0)
Alk. phosphatase: 160 U/L — ABNORMAL HIGH (ref 45–117)
Anion gap: 9 mmol/L (ref 3.0–18)
BUN/Creatinine ratio: 11 — ABNORMAL LOW (ref 12–20)
BUN: 9 MG/DL (ref 7.0–18)
Bilirubin, total: 0.5 MG/DL (ref 0.2–1.0)
CO2: 27 mmol/L (ref 21–32)
Calcium: 9.6 MG/DL (ref 8.5–10.1)
Chloride: 105 mmol/L (ref 100–108)
Creatinine: 0.82 MG/DL (ref 0.6–1.3)
GFR est AA: >60 mL/min/{1.73_m2} (ref >60)
GFR est non-AA: >60 mL/min/{1.73_m2} (ref >60)
Globulin: 4.4 g/dL — ABNORMAL HIGH (ref 2.0–4.0)
Glucose: 110 mg/dL — ABNORMAL HIGH (ref 74–99)
Potassium: 4.2 mmol/L (ref 3.5–5.5)
Protein, total: 9.1 g/dL — ABNORMAL HIGH (ref 6.4–8.2)
Sodium: 141 mmol/L (ref 136–145)

## 2015-08-25 LAB — URINALYSIS W/ RFLX MICROSCOPIC
Bilirubin: NEGATIVE
Blood: NEGATIVE
Glucose: NEGATIVE mg/dL
Nitrites: NEGATIVE
Protein: NEGATIVE mg/dL
Specific gravity: 1.019 (ref 1.005–1.030)
Urobilinogen: 0.2 EU/dL (ref 0.2–1.0)
pH (UA): 6.5 (ref 5.0–8.0)

## 2015-08-25 LAB — CBC WITH AUTOMATED DIFF
ABS. BASOPHILS: 0 10*3/uL (ref 0.0–0.06)
ABS. EOSINOPHILS: 0.1 10*3/uL (ref 0.0–0.4)
ABS. LYMPHOCYTES: 2.3 10*3/uL (ref 0.9–3.6)
ABS. MONOCYTES: 0.6 10*3/uL (ref 0.05–1.2)
ABS. NEUTROPHILS: 8.2 10*3/uL — ABNORMAL HIGH (ref 1.8–8.0)
BASOPHILS: 0 % (ref 0–2)
EOSINOPHILS: 1 % (ref 0–5)
HCT: 53.7 % — ABNORMAL HIGH (ref 35.0–45.0)
HGB: 17.8 g/dL — ABNORMAL HIGH (ref 12.0–16.0)
LYMPHOCYTES: 20 % — ABNORMAL LOW (ref 21–52)
MCH: 28.2 PG (ref 24.0–34.0)
MCHC: 33.1 g/dL (ref 31.0–37.0)
MCV: 85 FL (ref 74.0–97.0)
MONOCYTES: 5 % (ref 3–10)
MPV: 10.6 FL (ref 9.2–11.8)
NEUTROPHILS: 74 % — ABNORMAL HIGH (ref 40–73)
PLATELET: 339 10*3/uL (ref 135–420)
RBC: 6.32 M/uL — ABNORMAL HIGH (ref 4.20–5.30)
RDW: 14.5 % (ref 11.6–14.5)
WBC: 11.2 10*3/uL (ref 4.6–13.2)

## 2015-08-25 LAB — URINE MICROSCOPIC ONLY
Bacteria: NEGATIVE /hpf
Epithelial cells: NEGATIVE /lpf (ref 0–5)
RBC: NEGATIVE /hpf (ref 0–5)
WBC: 0 /hpf (ref 0–4)

## 2015-08-25 LAB — TSH 3RD GENERATION: TSH: 0.22 u[IU]/mL — ABNORMAL LOW (ref 0.36–3.74)

## 2015-08-25 MED ORDER — FENTANYL 100 MCG/HR 72 HR TRANSDERM PATCH
100 mcg/hr | MEDICATED_PATCH | TRANSDERMAL | 0 refills | Status: DC
Start: 2015-08-25 — End: 2015-08-25

## 2015-08-25 MED ORDER — FENTANYL 100 MCG/HR 72 HR TRANSDERM PATCH
100 mcg/hr | MEDICATED_PATCH | TRANSDERMAL | 0 refills | Status: DC
Start: 2015-08-25 — End: 2016-02-28

## 2015-08-25 MED ORDER — ALPRAZOLAM 2 MG TAB
2 mg | ORAL_TABLET | Freq: Two times a day (BID) | ORAL | 0 refills | Status: DC | PRN
Start: 2015-08-25 — End: 2015-08-25

## 2015-08-25 MED ORDER — OXYCODONE-ACETAMINOPHEN 10 MG-325 MG TAB
10-325 mg | ORAL_TABLET | Freq: Three times a day (TID) | ORAL | 0 refills | Status: DC | PRN
Start: 2015-08-25 — End: 2015-08-25

## 2015-08-25 MED ORDER — FENTANYL 100 MCG/HR 72 HR TRANSDERM PATCH
100 mcg/hr | MEDICATED_PATCH | TRANSDERMAL | 0 refills | Status: DC
Start: 2015-08-25 — End: 2015-09-22

## 2015-08-25 MED ORDER — ALPRAZOLAM 2 MG TAB
2 mg | ORAL_TABLET | Freq: Two times a day (BID) | ORAL | 0 refills | Status: DC | PRN
Start: 2015-08-25 — End: 2015-09-22

## 2015-08-25 MED ORDER — OXYCODONE-ACETAMINOPHEN 10 MG-325 MG TAB
10-325 mg | ORAL_TABLET | Freq: Three times a day (TID) | ORAL | 0 refills | Status: DC | PRN
Start: 2015-08-25 — End: 2015-09-22

## 2015-08-25 MED ORDER — FENTANYL 50 MCG/HR 72 HR TRANSDERM PATCH
50 mcg/hr | MEDICATED_PATCH | TRANSDERMAL | Status: DC
Start: 2015-08-25 — End: 2015-09-22

## 2015-08-25 NOTE — Telephone Encounter (Signed)
Spoke with Vonna Kotykjay from [facility] and stated that patches and oxycodone adds up to over >400 meq of morphine (O.D range). He gave me a telephone number to start prior auth at [phone]

## 2015-08-25 NOTE — Telephone Encounter (Signed)
Will ask pt to make appt to discuss labs next week. important
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
## 2015-08-25 NOTE — Telephone Encounter (Signed)
[facility] called stating that [facility] to a medication and wanted to speak back with you. Please assist.

## 2015-08-26 NOTE — Telephone Encounter (Signed)
I tried calling the patient to set up a follow up appnt for next week to discuss labs with Dr Danella Pentoneshmukh. She didn't answer. I left a voicemail providing her with the number to our office.     I also stated that [place] tried to call her to schedule an appnt with her but she did not answer. I advised her through the voicemail that she needs to call them back as soon as she can. (more info under referral tab)

## 2015-09-22 ENCOUNTER — Inpatient Hospital Stay: Admit: 2015-09-22 | Payer: MEDICARE | Attending: Internal Medicine

## 2015-09-22 ENCOUNTER — Encounter

## 2015-09-22 ENCOUNTER — Inpatient Hospital Stay: Admit: 2015-09-22 | Payer: MEDICARE

## 2015-09-22 ENCOUNTER — Ambulatory Visit: Admit: 2015-09-22 | Payer: MEDICARE | Attending: Internal Medicine

## 2015-09-22 DIAGNOSIS — D751 Secondary polycythemia: Secondary | ICD-10-CM

## 2015-09-22 DIAGNOSIS — N83201 Unspecified ovarian cyst, right side: Secondary | ICD-10-CM

## 2015-09-22 DIAGNOSIS — I10 Essential (primary) hypertension: Secondary | ICD-10-CM

## 2015-09-22 LAB — CBC WITH AUTOMATED DIFF
ABS. BASOPHILS: 0 10*3/uL (ref 0.0–0.06)
ABS. EOSINOPHILS: 0.2 10*3/uL (ref 0.0–0.4)
ABS. LYMPHOCYTES: 4.1 10*3/uL — ABNORMAL HIGH (ref 0.9–3.6)
ABS. MONOCYTES: 1.1 10*3/uL (ref 0.05–1.2)
ABS. NEUTROPHILS: 12.3 10*3/uL — ABNORMAL HIGH (ref 1.8–8.0)
BASOPHILS: 0 % (ref 0–2)
EOSINOPHILS: 1 % (ref 0–5)
HCT: 52.9 % — ABNORMAL HIGH (ref 35.0–45.0)
HGB: 18 g/dL — ABNORMAL HIGH (ref 12.0–16.0)
LYMPHOCYTES: 23 % (ref 21–52)
MCH: 28.2 PG (ref 24.0–34.0)
MCHC: 34 g/dL (ref 31.0–37.0)
MCV: 82.9 FL (ref 74.0–97.0)
MONOCYTES: 6 % (ref 3–10)
MPV: 10.5 FL (ref 9.2–11.8)
NEUTROPHILS: 70 % (ref 40–73)
PLATELET COMMENTS: ADEQUATE
PLATELET: 394 10*3/uL (ref 135–420)
RBC: 6.38 M/uL — ABNORMAL HIGH (ref 4.20–5.30)
RDW: 14.2 % (ref 11.6–14.5)
WBC: 17.7 10*3/uL — ABNORMAL HIGH (ref 4.6–13.2)

## 2015-09-22 LAB — HEMOGLOBIN A1C WITH EAG
Est. average glucose: 123 mg/dL
Hemoglobin A1c: 5.9 % — ABNORMAL HIGH (ref 4.2–5.6)

## 2015-09-22 LAB — T3, FREE: Triiodothyronine (T3), free: 3.1 PG/ML (ref 2.3–4.2)

## 2015-09-22 LAB — T4, FREE: T4, Free: 1.2 NG/DL (ref 0.7–1.5)

## 2015-09-22 MED ORDER — CARVEDILOL 12.5 MG TAB
12.5 mg | ORAL_TABLET | Freq: Two times a day (BID) | ORAL | 3 refills | Status: DC
Start: 2015-09-22 — End: 2015-09-30

## 2015-09-22 MED ORDER — FENTANYL 100 MCG/HR 72 HR TRANSDERM PATCH
100 mcg/hr | MEDICATED_PATCH | TRANSDERMAL | 0 refills | Status: DC
Start: 2015-09-22 — End: 2016-02-28

## 2015-09-22 MED ORDER — OXYCODONE-ACETAMINOPHEN 10 MG-325 MG TAB
10-325 mg | ORAL_TABLET | Freq: Three times a day (TID) | ORAL | 0 refills | Status: DC | PRN
Start: 2015-09-22 — End: 2016-02-28

## 2015-09-22 MED ORDER — ALPRAZOLAM 2 MG TAB
2 mg | ORAL_TABLET | Freq: Two times a day (BID) | ORAL | 0 refills | Status: DC | PRN
Start: 2015-09-22 — End: 2015-09-27

## 2015-09-22 MED ORDER — OXYCODONE-ACETAMINOPHEN 10 MG-325 MG TAB
10-325 mg | ORAL_TABLET | Freq: Three times a day (TID) | ORAL | 0 refills | Status: DC | PRN
Start: 2015-09-22 — End: 2015-09-22

## 2015-09-22 MED ORDER — NITROFURANTOIN MACROCRYSTAL 100 MG CAP
100 mg | ORAL_CAPSULE | Freq: Two times a day (BID) | ORAL | 0 refills | Status: DC
Start: 2015-09-22 — End: 2015-09-30

## 2015-09-22 MED ORDER — FENTANYL 50 MCG/HR 72 HR TRANSDERM PATCH
50 mcg/hr | MEDICATED_PATCH | TRANSDERMAL | Status: DC
Start: 2015-09-22 — End: 2016-02-28

## 2015-09-22 NOTE — Progress Notes (Signed)
1. Have you been to the ER, urgent care clinic since your last visit?  Hospitalized since your last visit?No    2. Have you seen or consulted any other health care providers outside of the [facility] since your last visit?  Include any pap smears or colon screening. No

## 2015-09-22 NOTE — Telephone Encounter (Signed)
Will ask pt to go to ER if having fever , chills or any sign of infection.  If she has no symptoms of infection, I would see the pt in the clinc next week to discuss labs.

## 2015-09-22 NOTE — Progress Notes (Signed)
Sherri Elliott is a 40 y.o.  female and presents with     Chief Complaint   Patient presents with   ??? Hypertension   ??? Anxiety   ??? Back Pain   ??? Irregular Menses   ??? Nicotine Dependence   ??? Dysuria     Pt has anxiety, back pain , PCOS.  Pt has h/o colon cancer.  Pt smokes .  Pt has dysuria.  Pt has rt lower quadrant abdominal pain      Past Medical History   Diagnosis Date   ??? Asthma    ??? Musculoskeletal disorder      Chronic back pain     Past Surgical History   Procedure Laterality Date   ??? Hx hemorrhoidectomy     ??? Hx bunionectomy     ??? Hx orthopaedic       bunionectomy   ??? Hx gi       hemorroidectomy   ??? Hx colposcopy       Current Outpatient Prescriptions   Medication Sig   ??? carvedilol (COREG) 12.5 mg tablet Take 1 Tab by mouth two (2) times daily (with meals).   ??? nitrofurantoin (MACRODANTIN) 100 mg capsule Take 1 Cap by mouth two (2) times a day for 7 days.   ??? fentaNYL (DURAGESIC) 50 mcg/hr PATCH 1 Patch by TransDERmal route every seventy-two (72) hours. Max Daily Amount: 1 Patch.   ??? ALPRAZolam (XANAX) 2 mg tablet Take 1 Tab by mouth two (2) times daily as needed for Anxiety. Max Daily Amount: 4 mg.   ??? oxyCODONE-acetaminophen (PERCOCET 10) 10-325 mg per tablet Take 1 Tab by mouth every eight (8) hours as needed for Pain. Max Daily Amount: 3 Tabs.   ??? fentaNYL (DURAGESIC) 100 mcg/hr PATCH 1 Patch by TransDERmal route every fourty-eight (48) hours. Max Daily Amount: 1 Patch.   ??? fentaNYL (DURAGESIC) 100 mcg/hr PATCH 1 Patch by TransDERmal route every fourty-eight (48) hours. Max Daily Amount: 1 Patch.   ??? budesonide (PULMICORT) 180 mcg/actuation aepb inhaler Take 2 Puffs by inhalation.   ??? albuterol (PROVENTIL HFA, VENTOLIN HFA) 90 mcg/actuation inhaler Take 2 Puffs by inhalation every four (4) hours as needed.     ??? cloNIDine HCl (CATAPRES) 0.1 mg tablet Take 1 Tab by mouth daily as needed. Indications: OPIOID WITHDRAWAL SYMPTOMS     No current facility-administered medications for this visit.       Health Maintenance   Topic Date Due   ??? Pneumococcal 19-64 Highest Risk (1 of 3 - PCV13) 08/07/1997   ??? DTaP/Tdap/Td series (1 - Tdap) 08/08/1999   ??? PAP AKA CERVICAL CYTOLOGY  08/08/1999   ??? INFLUENZA AGE 67 TO ADULT  03/14/2015       There is no immunization history on file for this patient.  No LMP recorded. Patient is not currently having periods (Reason: Polycystic Ovarian Syndrome).        Allergies and Intolerances:   Allergies   Allergen Reactions   ??? Latex Hives   ??? Aspirin Hives   ??? Banana Hives   ??? Codeine Hives   ??? Motrin [Ibuprofen] Hives   ??? Toradol [Ketorolac Tromethamine] Hives   ??? Tramadol Hives   ??? Tape [Adhesive] Hives   ??? Venom-Honey Bee Hives       Family History:   No family history on file.    Social History:   She  reports that she has been smoking.  She has been smoking about 0.25 packs  per day. She does not have any smokeless tobacco history on file.  She  reports that she does not drink alcohol.            Review of Systems:   General: negative for - chills, fatigue, fever, pos for  weight change  Psych: positive for - anxiety, depression,  ENT: negative for - headaches, hearing change, nasal congestion, oral lesions, sneezing or sore throat  Heme/ Lymph: negative for - bleeding problems, bruising, pallor or swollen lymph nodes  Endo: negative for - hot flashes, polydipsia/polyuria or temperature intolerance  Resp: negative for - cough, shortness of breath or wheezing  CV: negative for - chest pain, edema or palpitations  GI: negative for -change in bowel habits, constipation, diarrhea or nausea/vomiting, pos for rt sided pelvic pain  GU: negative for - hematuria, incontinence, pelvic pain or vulvar/vaginal symptoms, pos for dysuria  MSK: negative for - joint pain, joint swelling or muscle pain, pos for back pain  Neuro: negative for - confusion, headaches, seizures or weakness  Derm: negative for - dry skin, hair changes, rash or skin lesion changes          Physical:   Vitals:   Vitals:     09/22/15 0834   BP: (!) 148/107   Pulse: (!) 105   Resp: 17   Temp: 98.4 ??F (36.9 ??C)   TempSrc: Oral   SpO2: 97%   Weight: 186 lb (84.4 kg)   Height:  (1.6 m)           Exam:   HEENT- atraumatic,normocephalic, awake, oriented, well nourished  Neck - supple,no enlarged lymph nodes, no JVD, no thyromegaly  Chest- CTA, no rhonchi, no crackles  Heart- rrr, no murmurs / gallop/rub  Abdomen- soft,BS+,NT, no hepatosplenomegaly, rt sided pelvic pain   Ext - no c/c/edema   Neuro- no focal deficits.Power 5/5 all extremities  Skin - warm,dry, no obvious rashes.          Review of Data:   LABS:   Lab Results   Component Value Date/Time    WBC 11.2 08/25/2015 08:54 AM    HGB 17.8 08/25/2015 08:54 AM    HCT 53.7 08/25/2015 08:54 AM    PLATELET 339 08/25/2015 08:54 AM     Lab Results   Component Value Date/Time    Sodium 141 08/25/2015 08:54 AM    Potassium 4.2 08/25/2015 08:54 AM    Chloride 105 08/25/2015 08:54 AM    CO2 27 08/25/2015 08:54 AM    Glucose 110 08/25/2015 08:54 AM    BUN 9 08/25/2015 08:54 AM    Creatinine 0.82 08/25/2015 08:54 AM     Lab Results   Component Value Date/Time    Cholesterol, total 186 08/25/2015 08:54 AM    HDL Cholesterol 47 08/25/2015 08:54 AM    LDL, calculated 111.2 08/25/2015 08:54 AM    Triglyceride 139 08/25/2015 08:54 AM     No results found for: GPT        Impression / Plan:        ICD-10-CM ICD-9-CM    1. Essential hypertension I10 401.9 carvedilol (COREG) 12.5 mg tablet   2. Polycythemia (HCC) D75.1 238.4 ERYTHROPOIETIN      CBC WITH AUTOMATED DIFF      REFERRAL TO HEMATOLOGY   3. Low TSH level R94.6 794.5 T3, FREE      T4, FREE      US THYROID/PARATHYROID/SOFT TISS   4. IFG (impaired fasting glucose) R73.01  790.21 HEMOGLOBIN A1C WITH EAG   5. PCOS (polycystic ovarian syndrome) E28.2 256.4    6. Midline low back pain with sciatica, sciatica laterality unspecified, unspecified chronicity M54.40 724.3 fentaNYL (DURAGESIC) 50 mcg/hr PATCH      ALPRAZolam (XANAX) 2 mg tablet       oxyCODONE-acetaminophen (PERCOCET 10) 10-325 mg per tablet   7. Urinary tract infection without hematuria, site unspecified N39.0 599.0 CULTURE, URINE      nitrofurantoin (MACRODANTIN) 100 mg capsule   8. Malignant neoplasm of colon, unspecified part of colon (HCC) C18.9 153.9 CEA      REFERRAL TO GASTROENTEROLOGY     Will need repeat colonoscopy     Explained to patient risk benefits of the medications.Advised patient to stop meds if having any side effects.Pt verbalized understanding of the instructions.    I have discussed the diagnosis with the patient and the intended plan as seen in the above orders.  The patient has received an after-visit summary and questions were answered concerning future plans.  I have discussed medication side effects and warnings with the patient as well. I have reviewed the plan of care with the patient, accepted their input and they are in agreement with the treatment goals.     Reviewed plan of care. Patient has provided input and agrees with goals.    Follow-up Disposition: Not on File    Jenene Slicker, MD

## 2015-09-23 LAB — ERYTHROPOIETIN: Erythropoietin: 12.4 m[IU]/mL (ref 2.6–18.5)

## 2015-09-23 LAB — CEA: CEA: 1 ng/mL

## 2015-09-24 LAB — CULTURE, URINE
Culture result:: NO GROWTH
Culture: NO GROWTH

## 2015-09-27 ENCOUNTER — Ambulatory Visit: Admit: 2015-09-27 | Discharge: 2015-09-27 | Payer: MEDICARE | Attending: Medical Oncology

## 2015-09-27 ENCOUNTER — Inpatient Hospital Stay: Admit: 2015-09-27 | Payer: MEDICARE

## 2015-09-27 ENCOUNTER — Ambulatory Visit: Admit: 2015-09-27 | Discharge: 2015-09-27 | Payer: MEDICARE | Attending: Internal Medicine

## 2015-09-27 DIAGNOSIS — M5440 Lumbago with sciatica, unspecified side: Secondary | ICD-10-CM

## 2015-09-27 DIAGNOSIS — D751 Secondary polycythemia: Secondary | ICD-10-CM

## 2015-09-27 LAB — METABOLIC PANEL, COMPREHENSIVE
A-G Ratio: 1.1 (ref 0.8–1.7)
ALT (SGPT): 65 U/L — ABNORMAL HIGH (ref 13–56)
AST (SGOT): 51 U/L — ABNORMAL HIGH (ref 15–37)
Albumin: 4.1 g/dL (ref 3.4–5.0)
Alk. phosphatase: 136 U/L — ABNORMAL HIGH (ref 45–117)
Anion gap: 9 mmol/L (ref 3.0–18)
BUN/Creatinine ratio: 13 (ref 12–20)
BUN: 8 MG/DL (ref 7.0–18)
Bilirubin, total: 0.2 MG/DL (ref 0.2–1.0)
CO2: 24 mmol/L (ref 21–32)
Calcium: 9.1 MG/DL (ref 8.5–10.1)
Chloride: 103 mmol/L (ref 100–108)
Creatinine: 0.64 MG/DL (ref 0.6–1.3)
GFR est AA: >60 mL/min/{1.73_m2} (ref >60)
GFR est non-AA: >60 mL/min/{1.73_m2} (ref >60)
Globulin: 3.9 g/dL (ref 2.0–4.0)
Glucose: 95 mg/dL (ref 74–99)
Potassium: 4.3 mmol/L (ref 3.5–5.5)
Protein, total: 8 g/dL (ref 6.4–8.2)
Sodium: 136 mmol/L (ref 136–145)

## 2015-09-27 LAB — CBC WITH 3 PART DIFF
ABS. LYMPHOCYTES: 3.6 10*3/uL (ref 1.1–5.9)
ABS. LYMPHOCYTES: 3.7 10*3/uL (ref 1.1–5.9)
ABS. MIXED CELLS: 0.7 10*3/uL (ref 0.0–2.3)
ABS. MIXED CELLS: 0.9 10*3/uL (ref 0.0–2.3)
ABS. NEUTROPHILS: 7.6 10*3/uL (ref 1.8–9.5)
ABS. NEUTROPHILS: 7.6 10*3/uL (ref 1.8–9.5)
HCT: 47.2 % (ref 36–48)
HCT: 47.9 % (ref 36–48)
HGB: 15.4 g/dL (ref 12.0–16.0)
HGB: 15.4 g/dL (ref 12.0–16.0)
LYMPHOCYTES: 30 % (ref 14–44)
LYMPHOCYTES: 31 % (ref 14–44)
MCH: 27.2 PG (ref 25.0–35.0)
MCH: 27.5 PG (ref 25.0–35.0)
MCHC: 32.2 g/dL (ref 31–37)
MCHC: 32.6 g/dL (ref 31–37)
MCV: 84.3 FL (ref 78–102)
MCV: 84.6 FL (ref 78–102)
Mixed cells: 6 % (ref 0.1–17)
Mixed cells: 8 % (ref 0.1–17)
NEUTROPHILS: 62 % (ref 40–70)
NEUTROPHILS: 64 % (ref 40–70)
PLATELET: 403 10*3/uL (ref 140–440)
RBC: 5.6 M/uL — ABNORMAL HIGH (ref 4.10–5.10)
RBC: 5.66 M/uL — ABNORMAL HIGH (ref 4.10–5.10)
RDW: 14.5 % (ref 11.5–14.5)
RDW: 14.5 % (ref 11.5–14.5)
WBC: 11.9 10*3/uL (ref 4.5–13.0)
WBC: 12.2 10*3/uL (ref 4.5–13.0)

## 2015-09-27 LAB — CARBOXYHEMOGLOBIN
Arterial O2 Hgb: 92.9 % — ABNORMAL LOW (ref 94–97)
CARBOXYHEMOGLOBIN: 8 % — ABNORMAL HIGH (ref 0.0–1.5)
METHEMOGLOBIN: 1 % (ref 0–1.5)
TOTAL HEMOGLOBIN: 16 GM/DL (ref 12.0–16.0)

## 2015-09-27 LAB — RETICULOCYTE COUNT: Reticulocyte count: 1.9 % (ref 0.5–2.3)

## 2015-09-27 LAB — C REACTIVE PROTEIN, QT: C-Reactive protein: <0.3 mg/dL (ref 0–0.3)

## 2015-09-27 MED ORDER — SODIUM CHLORIDE 0.9 % IV
Freq: Once | INTRAVENOUS | Status: DC
Start: 2015-09-27 — End: 2015-09-27

## 2015-09-27 MED ORDER — ALPRAZOLAM 2 MG TAB
2 mg | ORAL_TABLET | Freq: Two times a day (BID) | ORAL | 0 refills | Status: DC | PRN
Start: 2015-09-27 — End: 2016-04-25

## 2015-09-27 MED FILL — SODIUM CHLORIDE 0.9 % IV: INTRAVENOUS | Qty: 500

## 2015-09-27 NOTE — Telephone Encounter (Signed)
Pt pharmacy called stating that pt will be out of the following medication by Sunday so they would like to know if Dr. Algis Downs will change the date for the 17th instead since they are not open on Sunday. Please assist.

## 2015-09-27 NOTE — Progress Notes (Signed)
Sherri Elliott is a 40 y.o. female who presents today with polycythemia     1. Have you been to the ER, urgent care clinic since your last visit?  Hospitalized since your last visit?NO    2. Have you seen or consulted any other health care providers outside of the [facility] since your last visit?  Include any pap smears or colon screening.NO    Health Maintenance reviewed.    Health Maintenance Due   Topic Date Due   ??? Pneumococcal 19-64 Highest Risk (1 of 3 - PCV13) 08/07/1997   ??? DTaP/Tdap/Td series (1 - Tdap) 08/08/1999   ??? PAP AKA CERVICAL CYTOLOGY  08/08/1999

## 2015-09-27 NOTE — Telephone Encounter (Signed)
Please see message, it regards to xanax.

## 2015-09-27 NOTE — Progress Notes (Signed)
1. Have you been to the ER, urgent care clinic since your last visit?  Hospitalized since your last visit?No    2. Have you seen or consulted any other health care providers outside of the [facility] since your last visit?  Include any pap smears or colon screening. No

## 2015-09-27 NOTE — Progress Notes (Signed)
Sherri Elliott is a 40 y.o.  female and presents with     Chief Complaint   Patient presents with   ??? Anxiety   ??? Polycythemia       Pt is here as she feel she may run out of anxiety meds as her appt with Psych is on Marchj the 4th.Pt had repeat blood test that show elevated RBC and WBC count.  Pt denies any signor symptoms of infection.    Past Medical History   Diagnosis Date   ??? Asthma    ??? Musculoskeletal disorder      Chronic back pain     Past Surgical History   Procedure Laterality Date   ??? Hx hemorrhoidectomy     ??? Hx bunionectomy     ??? Hx orthopaedic       bunionectomy   ??? Hx gi       hemorroidectomy   ??? Hx colposcopy       Current Outpatient Prescriptions   Medication Sig   ??? [START ON 10/03/2015] ALPRAZolam (XANAX) 2 mg tablet Take 1 Tab by mouth two (2) times daily as needed for Anxiety. Max Daily Amount: 4 mg.   ??? carvedilol (COREG) 12.5 mg tablet Take 1 Tab by mouth two (2) times daily (with meals).   ??? nitrofurantoin (MACRODANTIN) 100 mg capsule Take 1 Cap by mouth two (2) times a day for 7 days.   ??? fentaNYL (DURAGESIC) 50 mcg/hr PATCH 1 Patch by TransDERmal route every seventy-two (72) hours. Max Daily Amount: 1 Patch.   ??? fentaNYL (DURAGESIC) 100 mcg/hr PATCH 1 Patch by TransDERmal route every fourty-eight (48) hours. Max Daily Amount: 1 Patch.   ??? oxyCODONE-acetaminophen (PERCOCET 10) 10-325 mg per tablet Take 1 Tab by mouth every eight (8) hours as needed for Pain. Max Daily Amount: 3 Tabs.   ??? fentaNYL (DURAGESIC) 100 mcg/hr PATCH 1 Patch by TransDERmal route every fourty-eight (48) hours. Max Daily Amount: 1 Patch.   ??? cloNIDine HCl (CATAPRES) 0.1 mg tablet Take 1 Tab by mouth daily as needed. Indications: OPIOID WITHDRAWAL SYMPTOMS   ??? budesonide (PULMICORT) 180 mcg/actuation aepb inhaler Take 2 Puffs by inhalation.   ??? albuterol (PROVENTIL HFA, VENTOLIN HFA) 90 mcg/actuation inhaler Take 2 Puffs by inhalation every four (4) hours as needed.        No current facility-administered medications for this visit.      Health Maintenance   Topic Date Due   ??? Pneumococcal 19-64 Highest Risk (1 of 3 - PCV13) 08/07/1997   ??? DTaP/Tdap/Td series (1 - Tdap) 08/08/1999   ??? PAP AKA CERVICAL CYTOLOGY  08/08/1999   ??? INFLUENZA AGE 80 TO ADULT  Completed       There is no immunization history on file for this patient.  No LMP recorded. Patient is not currently having periods (Reason: Polycystic Ovarian Syndrome).        Allergies and Intolerances:   Allergies   Allergen Reactions   ??? Latex Hives   ??? Aspirin Hives   ??? Banana Hives   ??? Codeine Hives   ??? Motrin [Ibuprofen] Hives   ??? Toradol [Ketorolac Tromethamine] Hives   ??? Tramadol Hives   ??? Tape [Adhesive] Hives   ??? Venom-Honey Bee Hives       Family History:   No family history on file.    Social History:   She  reports that she has been smoking.  She has been smoking about 0.25 packs per day. She  has never used smokeless tobacco.  She  reports that she does not drink alcohol.            Review of Systems: pos for anxiety  General: negative for - chills, fatigue, fever, weight change  Psych: negative for - anxiety, depression, irritability or mood swings  ENT: negative for - headaches, hearing change, nasal congestion, oral lesions, sneezing or sore throat  Heme/ Lymph: negative for - bleeding problems, bruising, pallor or swollen lymph nodes  Endo: negative for - hot flashes, polydipsia/polyuria or temperature intolerance  Resp: negative for - cough, shortness of breath or wheezing  CV: negative for - chest pain, edema or palpitations  GI: negative for - abdominal pain, change in bowel habits, constipation, diarrhea or nausea/vomiting  GU: negative for - dysuria, hematuria, incontinence, pelvic pain or vulvar/vaginal symptoms  MSK: negative for - joint pain, joint swelling or muscle pain  Neuro: negative for - confusion, headaches, seizures or weakness  Derm: negative for - dry skin, hair changes, rash or skin lesion changes           Physical:   Vitals:   Vitals:    09/27/15 0939   BP: 134/89   Pulse: 95   Resp: 16   Temp: 97.6 ??F (36.4 ??C)   TempSrc: Oral   SpO2: 99%   Weight: 194 lb (88 kg)   Height:  (1.6 m)           Exam:   HEENT- atraumatic,normocephalic, awake, oriented, well nourished  Neck - supple,no enlarged lymph nodes, no JVD, no thyromegaly  Chest- CTA, no rhonchi, no crackles  Heart- rrr, no murmurs / gallop/rub  Abdomen- soft,BS+,NT, no hepatosplenomegaly  Ext - no c/c/edema   Neuro- no focal deficits.Power 5/5 all extremities  Skin - warm,dry, no obvious rashes.          Review of Data:   LABS:   Lab Results   Component Value Date/Time    WBC 17.7 09/22/2015 09:17 AM    HGB 18.0 09/22/2015 09:17 AM    HCT 52.9 09/22/2015 09:17 AM    PLATELET 394 09/22/2015 09:17 AM     Lab Results   Component Value Date/Time    Sodium 141 08/25/2015 08:54 AM    Potassium 4.2 08/25/2015 08:54 AM    Chloride 105 08/25/2015 08:54 AM    CO2 27 08/25/2015 08:54 AM    Glucose 110 08/25/2015 08:54 AM    BUN 9 08/25/2015 08:54 AM    Creatinine 0.82 08/25/2015 08:54 AM     Lab Results   Component Value Date/Time    Cholesterol, total 186 08/25/2015 08:54 AM    HDL Cholesterol 47 08/25/2015 08:54 AM    LDL, calculated 111.2 08/25/2015 08:54 AM    Triglyceride 139 08/25/2015 08:54 AM     No results found for: GPT        Impression / Plan:        ICD-10-CM ICD-9-CM    1. Midline low back pain with sciatica, sciatica laterality unspecified, unspecified chronicity M54.40 724.3 ALPRAZolam (XANAX) 2 mg tablet     Polycythemia , elevated white count - no e/o infection -asked pt to make appt with Hematology ASAP.  Go to ER , for symptosm of tingling , numbness , headaches.      Explained to patient risk benefits of the medications.Advised patient to stop meds if having any side effects.Pt verbalized understanding of the instructions.    I have discussed the diagnosis  with the patient and the intended plan as  seen in the above orders.  The patient has received an after-visit summary and questions were answered concerning future plans.  I have discussed medication side effects and warnings with the patient as well. I have reviewed the plan of care with the patient, accepted their input and they are in agreement with the treatment goals.     Reviewed plan of care. Patient has provided input and agrees with goals.    Follow-up Disposition: Not on File    Jenene Slicker, MD

## 2015-09-27 NOTE — Progress Notes (Signed)
[facility] Progress Note                   Date: September 27, 2015    Name: Sherri Elliott    MRN: [id number]    DOB: [birth year]    THERAPEUTIC PHLEBOTOMY    Sherri Elliott to [facility], ambulatory at 1250. Pt was accessed and education was provided.    Sherri Elliott vitals were reviewed and patient was observed for 5 minutes prior to treatment.  Visit Vitals   ??? BP 135/89 (BP 1 Location: Right arm, BP Patient Position: Sitting)   ??? Pulse 94   ??? Temp 96.9 ??F (36.1 ??C)   ??? Resp 16   ??? Ht '5\' 3"'  (1.6 m)   ??? Wt 87.5 kg (192 lb 12.8 oz)   ??? SpO2 100%   ??? Breastfeeding No   ??? BMI 34.15 kg/m2     Recent Results (from the past 12 hour(s))   CBC WITH 3 PART DIFF    Collection Time: 09/27/15  1:50 PM   Result Value Ref Range    WBC 11.9 4.5 - 13.0 K/uL    RBC 5.60 (H) 4.10 - 5.10 M/uL    HGB 15.4 12.0 - 16.0 g/dL    HCT 47.2 36 - 48 %    MCV 84.3 78 - 102 FL    MCH 27.5 25.0 - 35.0 PG    MCHC 32.6 31 - 37 g/dL    RDW 14.5 11.5 - 14.5 %    NEUTROPHILS 64 40 - 70 %    MIXED CELLS 6 0.1 - 17 %    LYMPHOCYTES 30 14 - 44 %    ABS. NEUTROPHILS 7.6 1.8 - 9.5 K/UL    ABS. MIXED CELLS 0.7 0.0 - 2.3 K/uL    ABS. LYMPHOCYTES 3.6 1.1 - 5.9 K/UL    DF AUTOMATED     CBC WITH 3 PART DIFF    Collection Time: 09/27/15  1:50 PM   Result Value Ref Range    WBC 12.2 4.5 - 13.0 K/uL    RBC 5.66 (H) 4.10 - 5.10 M/uL    HGB 15.4 12.0 - 16.0 g/dL    HCT 47.9 36 - 48 %    MCV 84.6 78 - 102 FL    MCH 27.2 25.0 - 35.0 PG    MCHC 32.2 31 - 37 g/dL    RDW 14.5 11.5 - 14.5 %    PLATELET 403 140 - 440 K/uL    NEUTROPHILS 62 40 - 70 %    MIXED CELLS 8 0.1 - 17 %    LYMPHOCYTES 31 14 - 44 %    ABS. NEUTROPHILS 7.6 1.8 - 9.5 K/UL    ABS. MIXED CELLS 0.9 0.0 - 2.3 K/uL    ABS. LYMPHOCYTES 3.7 1.1 - 5.9 K/UL    DF AUTOMATED     RETICULOCYTE COUNT    Collection Time: 09/27/15  1:55 PM   Result Value Ref Range    Reticulocyte count 1.9 0.5 - 2.3 %   C REACTIVE PROTEIN, QT    Collection Time: 09/27/15  1:55 PM    Result Value Ref Range    C-Reactive protein <0.3 0 - 0.3 mg/dL   METABOLIC PANEL, COMPREHENSIVE    Collection Time: 09/27/15  1:55 PM   Result Value Ref Range    Sodium 136 136 - 145 mmol/L    Potassium 4.3 3.5 - 5.5 mmol/L    Chloride 103 100 -  108 mmol/L    CO2 24 21 - 32 mmol/L    Anion gap 9 3.0 - 18 mmol/L    Glucose 95 74 - 99 mg/dL    BUN 8 7.0 - 18 MG/DL    Creatinine 0.64 0.6 - 1.3 MG/DL    BUN/Creatinine ratio 13 12 - 20      GFR est AA >60 >60 ml/min/1.38m    GFR est non-AA >60 >60 ml/min/1.784m   Calcium 9.1 8.5 - 10.1 MG/DL    Bilirubin, total 0.2 0.2 - 1.0 MG/DL    ALT (SGPT) 65 (H) 13 - 56 U/L    AST (SGOT) 51 (H) 15 - 37 U/L    Alk. phosphatase 136 (H) 45 - 117 U/L    Protein, total 8.0 6.4 - 8.2 g/dL    Albumin 4.1 3.4 - 5.0 g/dL    Globulin 3.9 2.0 - 4.0 g/dL    A-G Ratio 1.1 0.8 - 1.7     CARBOXYHEMOGLOBIN    Collection Time: 09/27/15  1:55 PM   Result Value Ref Range    TOTAL HEMOGLOBIN 16.0 12.0 - 16.0 GM/DL    ARTERIAL O2 HGB 92.9 (L) 94 - 97 %    CARBOXYHEMOGLOBIN 8.0 (H) 0.0 - 1.5 %    METHEMOGLOBIN 1.0 0 - 1.5 %    SPECIMEN TYPE BLOOD           Blood obtained peripherally from RH x 2 attempt with PIV 20 G and sent to lab for Reticulocyte Count, Erthyropoeitin, JAK 2 Mutation Analysis, C Reactive Protein QT, CBCw/diff, CMP & Carboxyhemoglobin per written orders.  No bleeding or hematoma noted at site. PIV flushes easily, 450 ml of blood for TP. Patient refused the 500 of NS.  Notified Md and okay for patient to discharge without NS.    PIV removed, Gauze and coban applied to site.    Sherri Elliott the phlebotomy, and had no complaints. Patients armband removed and shredded.    Sherri Elliott discharged from Sherri Elliott stable condition at 1455. She is to return on 10/05/2015 at 1300 for  her next appointment.    Sherri LoganFebruary 14, 2017

## 2015-09-27 NOTE — Progress Notes (Signed)
Hematology/Oncology Consultation Note    Name: Sherri Elliott  Date: 09/27/2015  DOB: 05-15-1976    Jenene Slicker, MD       Sherri Elliott  is a 40 y.o.  year old that present with severe polycythemia and leukocytosis.   She smokes 1/2 PPD of tobacco.  She denies any marijuana use.  She denies and other exposures to smoke or car exhaust or other industrial exposures.  She recently decreased her tobacco consumption down to from 1 PPD.    Subjective: She is c/o intermittent dizziness, blurred vision, and pruritus.     Past Medical History   Diagnosis Date   ??? Asthma    ??? Musculoskeletal disorder      Chronic back pain       Allergies   Allergen Reactions   ??? Latex Hives   ??? Aspirin Hives   ??? Banana Hives   ??? Codeine Hives   ??? Motrin [Ibuprofen] Hives   ??? Toradol [Ketorolac Tromethamine] Hives   ??? Tramadol Hives   ??? Tape [Adhesive] Hives   ??? Venom-Honey Bee Hives       Past Surgical History   Procedure Laterality Date   ??? Hx hemorrhoidectomy     ??? Hx bunionectomy     ??? Hx orthopaedic       bunionectomy   ??? Hx gi       hemorroidectomy   ??? Hx colposcopy         Social History     Social History   ??? Marital status: UNKNOWN     Spouse name: N/A   ??? Number of children: N/A   ??? Years of education: N/A     Occupational History   ??? Not on file.     Social History Main Topics   ??? Smoking status: Current Every Day Smoker     Packs/day: 0.25   ??? Smokeless tobacco: Never Used   ??? Alcohol use No   ??? Drug use: No   ??? Sexual activity: Not Currently     Other Topics Concern   ??? Not on file     Social History Narrative       No family history on file.    Current Outpatient Prescriptions   Medication Sig Dispense Refill   ??? [START ON 10/03/2015] ALPRAZolam (XANAX) 2 mg tablet Take 1 Tab by mouth two (2) times daily as needed for Anxiety. Max Daily Amount: 4 mg. 20 Tab 0   ??? fentaNYL (DURAGESIC) 100 mcg/hr PATCH 1 Patch by TransDERmal route every fourty-eight (48) hours. Max Daily Amount: 1 Patch. 5 Patch 0    ??? carvedilol (COREG) 12.5 mg tablet Take 1 Tab by mouth two (2) times daily (with meals). 60 Tab 3   ??? nitrofurantoin (MACRODANTIN) 100 mg capsule Take 1 Cap by mouth two (2) times a day for 7 days. 14 Cap 0   ??? fentaNYL (DURAGESIC) 50 mcg/hr PATCH 1 Patch by TransDERmal route every seventy-two (72) hours. Max Daily Amount: 1 Patch. 3 Patch percocet   ??? fentaNYL (DURAGESIC) 100 mcg/hr PATCH 1 Patch by TransDERmal route every fourty-eight (48) hours. Max Daily Amount: 1 Patch. 5 Patch 0   ??? oxyCODONE-acetaminophen (PERCOCET 10) 10-325 mg per tablet Take 1 Tab by mouth every eight (8) hours as needed for Pain. Max Daily Amount: 3 Tabs. 30 Tab 0   ??? cloNIDine HCl (CATAPRES) 0.1 mg tablet Take 1 Tab by mouth daily as needed. Indications: OPIOID WITHDRAWAL SYMPTOMS  5 Tab 0   ??? budesonide (PULMICORT) 180 mcg/actuation aepb inhaler Take 2 Puffs by inhalation.     ??? albuterol (PROVENTIL HFA, VENTOLIN HFA) 90 mcg/actuation inhaler Take 2 Puffs by inhalation every four (4) hours as needed.                       Review of Systems    General ROS:The patient has no complaints and there is no physical distress evident.  Psychological ROS: patient denies having any psychological symptoms such as hallucinations, depression or anxiety.  Ophthalmic ROS:the patient denies having any visual impairment or eye discomfort. + blurred vision  ENT ROS: there are no abnormalities reported.  Allergy and Immunology ROS:the patient denies having any seasonal allergies or allergies to medications other than those already outlined above.  Hematological and Lymphatic ROS: the patient denies having any bruising, bleeding or lymphadenopathy.  Endocrine ROS: the patient denies having any heat or cold intolerance.  There is no history of diabetes or thyroid disorders.  Breast ROS: the patient denies having any history of breast mass, nipple discharge, or lumps.  Respiratory ROS:the patient denies having any cough, shortness of breath,  or dyspnea on exertion.  Cardiovascular ROS: there are no complaints of chest pain, palpitations, chest pounding, or dyspnea on exertion.  Gastrointestinal ROS: the patient denies having nausea, emesis, diarrhea, constipation, or blood in the stool.  Genito-Urinary ROS: the patient denies having urinary urgency, frequency, or dysuria.  Musculoskeletal ROS: with the exception of mild arthralgias the patient has no other musculoskeletal complaints.  Neurological ROS: + intermittent dizziness  Dermatological UJW:JXBJYNW denies having any unexplained rash, skin ulcerations, or ++ pruritus      Objective:     Visit Vitals   ??? BP 120/88 (BP 1 Location: Left arm, BP Patient Position: Sitting)   ??? Pulse 97   ??? Temp 98 ??F (36.7 ??C) (Oral)   ??? Resp 15   ??? Ht  (1.6 m)   ??? Wt 88 kg (194 lb)   ??? SpO2 100%   ??? BMI 34.37 kg/m2        Physical Exam:     Gen. Appearance: the patient is in no acute distress.    Skin: There is no evidence of bruise or rash.    HEENT: The head is normocephalic and atraumatic.  The conjunctiva and sclera are clear.  Pupils are equal, round, reactive to light, and accommodation.  The extraocular movements are intact.    ENT reveals no oral mucosal lesions or ulcerations.    Neck: Supple without lymphadenopathy or thyromegaly.    Lungs: Clear to auscultation and percussion; there are no wheezes or rhonchi.  Heart: Regular rate and rhythm; there are no murmurs, gallops, or rubs.  Abdomen: Bowel sounds are present and normal.  There is no guarding, tenderness, or hepatosplenomegaly.   Extremities: There is no clubbing, cyanosis, or edema.   Neurologic: There are no focal neurologic deficits.    Lymphatics: There is no palpable peripheral lymphadenopathy.    Lab data: reviewed and pending               Assessment:     1.) Polycythemia  -Secondary vs. Primary  -P. Vera from a chronic myeloproliferative disorder vs. Secondary polycythemia from tobacco abuse or other etiologies   -She is symptomatic (pruritus, blurred vision)    2.) Leukocytosis  -Reactive vs. Other etiologies    Plan:   -Comprehensive lab work-up in progress-JAK2  mutation etc.  -Remove one unit ( ) of blood today x 1 time  -Give 500cc's of 0.9% NS following phlebotomy  -She is unable to take ASA due to an allergy  -Goal in for a HCT of 45% or less (may need to repeat weekly therapeutic phlebotomy)  -We may also need to proceed with a BMBx and aspiration as part of diagnostic work-up  -She was counseled regarding tobacco cessation  -Rtc. in 1 week for a repeat CBC and to review her lab workup and to possibly repeat therapeutic phlebotomy      Earl Lites B. Daryl Eastern, MD Hematology-Medical Oncology  09/27/2015

## 2015-09-28 LAB — ERYTHROPOIETIN: Erythropoietin: 8.1 m[IU]/mL (ref 2.6–18.5)

## 2015-09-28 NOTE — Telephone Encounter (Signed)
Patient called in stating she is having issues getting her fentafly Patch from Pain management Please call to assist

## 2015-09-30 ENCOUNTER — Encounter

## 2015-09-30 NOTE — Progress Notes (Signed)
Late Documentation: On Friday 09/30/15 spend about 30 minutes with patient at front of the office. She stated she went to Pain Management and felt like she was being judged by Dr. Alben Spittle. She also states that Dr. Alben Spittle gave her mcg of fentanyl patches and turned in the prescription to [facility]. She was asking me or Dr. Danella Penton to call [facility] to cancel the order, and is requesting of fentanyl patches because that is what she is used to. I infomed her Dr. Danella Penton was not in the office, and would talk to him about it on Monday (today) but advised her that he may or may not cancel it. I then advised her to call Dr. Wende Mott office to and have him cancel the order since he prescribed the patches. Sherri Elliott also requested that I ask Dr. Danella Penton if he could work with her in regards to managing her pain. I repeatedly informed her that it is per Dr. Lurene Shadow discretion. She then stated that she talks to her fiduciary Sherri Elliott about all her doctors visits and states she was running low of her money for copays and medications.     I spoke with Dr. Danella Penton today and informed him of encounter I had with Sherri Elliott on Friday.  He states that he will not cancel the prescription and that patient is to consult pain management for any other concerns regarding pain medication.

## 2015-09-30 NOTE — Telephone Encounter (Signed)
Please send these medications to [facility] pharmacy. She never picked it up from [facility].    Requested Prescriptions     Pending Prescriptions Disp Refills   ??? nitrofurantoin (MACRODANTIN) 100 mg capsule 14 Cap 0     Sig: Take 1 Cap by mouth two (2) times a day for 7 days.   ??? carvedilol (COREG) 12.5 mg tablet 60 Tab 3     Sig: Take 1 Tab by mouth two (2) times daily (with meals).

## 2015-10-01 LAB — JAK2 MUTATION ANALYSIS

## 2015-10-03 MED ORDER — NITROFURANTOIN MACROCRYSTAL 100 MG CAP
100 mg | ORAL_CAPSULE | Freq: Two times a day (BID) | ORAL | 0 refills | Status: AC
Start: 2015-10-03 — End: 2015-10-09

## 2015-10-03 MED ORDER — CARVEDILOL 12.5 MG TAB
12.5 mg | ORAL_TABLET | Freq: Two times a day (BID) | ORAL | 3 refills | Status: DC
Start: 2015-10-03 — End: 2016-02-28

## 2015-10-03 NOTE — Telephone Encounter (Signed)
Patient is calling requesting Dr Danella Penton to cancel the order for her fentanyl patch that the patient is currently taking. Patient states that she has called Pain management but had to leave a message. Patient then called Vonna Kotyk from the [facility] and he told her that Selena Batten or Dr Danella Penton could cancel the prescription for the patches.     Patient states that she has spoken with Selena Batten at length on Friday.     Please advise.

## 2015-10-03 NOTE — Telephone Encounter (Signed)
Patient called requesting a call back regarding the call with nurse Selena Batten Friday. She gave Selena Batten a list of symptoms to discuss with provider.

## 2015-10-03 NOTE — Telephone Encounter (Signed)
Patient called, I informed her per Deshmukh he will not cancel the order for [facility]entanyl patch at the [facility] and will not manage her pain as she's already seeing pain management, and I asked her if she called the pain management doctor @ [facility] and she said she got an answering service. I advised her to call again tomorrow as they may be closed because it is a holiday. She said in the past she's tried injections and physical therapy, but it doesn't help. She said she doesn't feel good at all and thinks no one understands what she is going through. I told her I will reach out to Dr. Isabell Jarvis office in the morning to get more information about the visit.

## 2015-10-04 ENCOUNTER — Ambulatory Visit: Admit: 2015-10-04 | Discharge: 2015-10-04 | Payer: MEDICARE | Attending: Medical Oncology

## 2015-10-04 ENCOUNTER — Inpatient Hospital Stay: Admit: 2015-10-04 | Payer: MEDICARE

## 2015-10-04 ENCOUNTER — Encounter: Attending: Medical Oncology

## 2015-10-04 DIAGNOSIS — D751 Secondary polycythemia: Secondary | ICD-10-CM

## 2015-10-04 LAB — CBC WITH 3 PART DIFF
ABS. LYMPHOCYTES: 4 10*3/uL (ref 1.1–5.9)
ABS. MIXED CELLS: 1.5 10*3/uL (ref 0.0–2.3)
ABS. NEUTROPHILS: 8.3 10*3/uL (ref 1.8–9.5)
HCT: 42 % (ref 36–48)
HGB: 13.8 g/dL (ref 12.0–16.0)
LYMPHOCYTES: 29 % (ref 14–44)
MCH: 27.8 PG (ref 25.0–35.0)
MCHC: 32.9 g/dL (ref 31–37)
MCV: 84.7 FL (ref 78–102)
Mixed cells: 11 % (ref 0.1–17)
NEUTROPHILS: 60 % (ref 40–70)
PLATELET: 329 10*3/uL (ref 140–440)
RBC: 4.96 M/uL (ref 4.10–5.10)
RDW: 14.1 % (ref 11.5–14.5)
WBC: 13.8 10*3/uL — ABNORMAL HIGH (ref 4.5–13.0)

## 2015-10-04 NOTE — Progress Notes (Unsigned)
Patient walked in office today wanting to speak with me. She informed me when she saw Dr. Daryl Eastern today (hematology and oncology) and was informed that he advised her to cut down on smoking to help with her diagnosis.

## 2015-10-04 NOTE — Progress Notes (Signed)
Sherri Elliott is a 40 y.o. female who presents today for follow up.     1. Have you been to the ER, urgent care clinic since your last visit?  Hospitalized since your last visit?NO    2. Have you seen or consulted any other health care providers outside of the [facility] since your last visit?  Include any pap smears or colon screening.NO    Health Maintenance reviewed.    Health Maintenance Due   Topic Date Due   ??? Pneumococcal 19-64 Highest Risk (1 of 3 - PCV13) 08/07/1997   ??? DTaP/Tdap/Td series (1 - Tdap) 08/08/1999   ??? PAP AKA CERVICAL CYTOLOGY  08/08/1999

## 2015-10-04 NOTE — Progress Notes (Signed)
Hematology/Oncology Consultation Note    Name: Sherri Elliott  Date: 10/04/2015  DOB: October 06, 1975    Jenene Slicker, MD       Sherri Elliott  is a 40 y.o.  year woman that returns to [facility] today in regards to her severe polycythemia and leukocytosis.   She smokes 1/2 PPD of tobacco.  She recently decreased her tobacco consumption down to from 1 PPD.    Subjective: She is c/o intermittent dizziness, blurred vision, and pruritus.     Past Medical History   Diagnosis Date   ??? Asthma    ??? Musculoskeletal disorder      Chronic back pain       Allergies   Allergen Reactions   ??? Latex Hives   ??? Aspirin Hives   ??? Banana Hives   ??? Ciprofloxacin Hives   ??? Codeine Hives   ??? Motrin [Ibuprofen] Hives   ??? Toradol [Ketorolac Tromethamine] Hives   ??? Tramadol Hives   ??? Tape [Adhesive] Hives   ??? Venom-Honey Bee Hives       Past Surgical History   Procedure Laterality Date   ??? Hx hemorrhoidectomy     ??? Hx bunionectomy     ??? Hx orthopaedic       bunionectomy   ??? Hx gi       hemorroidectomy   ??? Hx colposcopy         Social History     Social History   ??? Marital status: UNKNOWN     Spouse name: N/A   ??? Number of children: N/A   ??? Years of education: N/A     Occupational History   ??? Not on file.     Social History Main Topics   ??? Smoking status: Current Every Day Smoker     Packs/day: 0.25   ??? Smokeless tobacco: Never Used   ??? Alcohol use No   ??? Drug use: No   ??? Sexual activity: Not Currently     Other Topics Concern   ??? Not on file     Social History Narrative       No family history on file.    Current Outpatient Prescriptions   Medication Sig Dispense Refill   ??? nitrofurantoin (MACRODANTIN) 100 mg capsule Take 1 Cap by mouth two (2) times a day for 7 days. 14 Cap 0   ??? carvedilol (COREG) 12.5 mg tablet Take 1 Tab by mouth two (2) times daily (with meals). 60 Tab 3   ??? ALPRAZolam (XANAX) 2 mg tablet Take 1 Tab by mouth two (2) times daily as needed for Anxiety. Max Daily Amount: 4 mg. 20 Tab 0    ??? fentaNYL (DURAGESIC) 50 mcg/hr PATCH 1 Patch by TransDERmal route every seventy-two (72) hours. Max Daily Amount: 1 Patch. 3 Patch percocet   ??? fentaNYL (DURAGESIC) 100 mcg/hr PATCH 1 Patch by TransDERmal route every fourty-eight (48) hours. Max Daily Amount: 1 Patch. 5 Patch 0   ??? oxyCODONE-acetaminophen (PERCOCET 10) 10-325 mg per tablet Take 1 Tab by mouth every eight (8) hours as needed for Pain. Max Daily Amount: 3 Tabs. 30 Tab 0   ??? fentaNYL (DURAGESIC) 100 mcg/hr PATCH 1 Patch by TransDERmal route every fourty-eight (48) hours. Max Daily Amount: 1 Patch. 5 Patch 0   ??? cloNIDine HCl (CATAPRES) 0.1 mg tablet Take 1 Tab by mouth daily as needed. Indications: OPIOID WITHDRAWAL SYMPTOMS 5 Tab 0   ??? budesonide (PULMICORT) 180 mcg/actuation aepb inhaler Take  2 Puffs by inhalation.     ??? albuterol (PROVENTIL HFA, VENTOLIN HFA) 90 mcg/actuation inhaler Take 2 Puffs by inhalation every four (4) hours as needed.                       Review of Systems    General ROS:The patient has no complaints and there is no physical distress evident.  Psychological ROS: patient denies having any psychological symptoms such as hallucinations, depression or anxiety.  Ophthalmic ROS:the patient denies having any visual impairment or eye discomfort. + blurred vision  ENT ROS: there are no abnormalities reported.  Allergy and Immunology ROS:the patient denies having any seasonal allergies or allergies to medications other than those already outlined above.  Hematological and Lymphatic ROS: the patient denies having any bruising, bleeding or lymphadenopathy.  Endocrine ROS: the patient denies having any heat or cold intolerance.  There is no history of diabetes or thyroid disorders.  Breast ROS: the patient denies having any history of breast mass, nipple discharge, or lumps.  Respiratory ROS:the patient denies having any cough, shortness of breath, or dyspnea on exertion.   Cardiovascular ROS: there are no complaints of chest pain, palpitations, chest pounding, or dyspnea on exertion.  Gastrointestinal ROS: the patient denies having nausea, emesis, diarrhea, constipation, or blood in the stool.  Genito-Urinary ROS: the patient denies having urinary urgency, frequency, or dysuria.  Musculoskeletal ROS: with the exception of mild arthralgias the patient has no other musculoskeletal complaints.  Neurological ROS: + intermittent dizziness  Dermatological ZOX:WRUEAVW denies having any unexplained rash, skin ulcerations, or ++ pruritus      Objective:     Visit Vitals   ??? BP 133/82 (BP 1 Location: Left arm, BP Patient Position: Sitting)   ??? Pulse 89   ??? Temp 98.5 ??F (36.9 ??C) (Oral)   ??? Resp 18   ??? Ht 5\' 3"  (1.6 m)   ??? Wt 87.5 kg (193 lb)   ??? SpO2 96%   ??? BMI 34.19 kg/m2        Physical Exam:     Gen. Appearance: the patient is in no acute distress.    Skin: There is no evidence of bruise or rash.    HEENT: The head is normocephalic and atraumatic.  The conjunctiva and sclera are clear.  Pupils are equal, round, reactive to light, and accommodation.  The extraocular movements are intact.    ENT reveals no oral mucosal lesions or ulcerations.    Neck: Supple without lymphadenopathy or thyromegaly.    Lungs: Clear to auscultation and percussion; there are no wheezes or rhonchi.  Heart: Regular rate and rhythm; there are no murmurs, gallops, or rubs.  Abdomen: Bowel sounds are present and normal.  There is no guarding, tenderness, or hepatosplenomegaly.   Extremities: There is no clubbing, cyanosis, or edema.   Neurologic: There are no focal neurologic deficits.    Lymphatics: There is no palpable peripheral lymphadenopathy.    Lab data: reviewed                Assessment:     1.) Polycythemia  -Secondary from tobacco abuse  -She is symptomatic (pruritus, blurred vision) but this has improved    2.) Leukocytosis  -Reactive vs. Other etiologies    Plan:      -Hb/HCT levels have significantly decresed  -Comprehensive lab work-up in progress-JAK2 mutation reviewed  -JAK2 was negative  -We removed one unit ( ) of blood today x 1 time  last week  -She is unable to tolerate ASA due to an allergy  -Goal in for a HCT of 45% or less (may need to repeat therapeutic phlebotomy)  -She was counseled again regarding tobacco cessation  -Rtc. in 1 month or a repeat CBC and to review her lab workup and to possibly repeat therapeutic phlebotomy      Earl Lites B. Daryl Eastern, MD Hematology-Medical Oncology  10/04/2015

## 2015-10-04 NOTE — Progress Notes (Signed)
[facility] Progress Note    Date: October 04, 2015    Name: Sherri Elliott    MRN: [id number]         DOB: [birth year]    Peripheral Lab Draw      Sherri Elliott to [facility], ambulatory at 1220. Pt was assessed and education was provided.     Sherri Elliott vitals were reviewed and patient was observed for 5 minutes prior to treatment.   Visit Vitals   ??? BP 133/82 (BP 1 Location: Left arm, BP Patient Position: Sitting)   ??? Pulse 89   ??? Temp 98.5 ??F (36.9 ??C)   ??? Resp 18   ??? Ht  (1.6 m)   ??? Wt 87.5 kg (193 lb)   ??? SpO2 96%   ??? BMI 34.19 kg/m2     Recent Results (from the past 12 hour(s))   CBC WITH 3 PART DIFF    Collection Time: 10/04/15 12:15 PM   Result Value Ref Range    WBC 13.8 (H) 4.5 - 13.0 K/uL    RBC 4.96 4.10 - 5.10 M/uL    HGB 13.8 12.0 - 16.0 g/dL    HCT 40.9 36 - 48 %    MCV 84.7 78 - 102 FL    MCH 27.8 25.0 - 35.0 PG    MCHC 32.9 31 - 37 g/dL    RDW 81.1 91.4 - 78.2 %    PLATELET 329 140 - 440 K/uL    NEUTROPHILS 60 40 - 70 %    MIXED CELLS 11 0.1 - 17 %    LYMPHOCYTES 29 14 - 44 %    ABS. NEUTROPHILS 8.3 1.8 - 9.5 K/UL    ABS. MIXED CELLS 1.5 0.0 - 2.3 K/uL    ABS. LYMPHOCYTES 4.0 1.1 - 5.9 K/UL    DF AUTOMATED         Blood obtained peripherally from LFA x 2 attempt with butterfly needle and sent to lab for CBC per written orders. No bleeding or hematoma noted at site. Guaze and coban applied.     Sherri Elliott tolerated the phlebotomy, and had no complaints.  Patient armband removed and shredded.    [facility] 1240. She is to for follow up with physician.    Thornton Park  October 04, 2015

## 2015-10-20 ENCOUNTER — Ambulatory Visit: Admit: 2015-10-20 | Discharge: 2015-10-20 | Payer: MEDICARE | Attending: Internal Medicine

## 2015-10-20 ENCOUNTER — Inpatient Hospital Stay: Admit: 2015-10-20 | Payer: MEDICARE | Attending: Internal Medicine

## 2015-10-20 DIAGNOSIS — I10 Essential (primary) hypertension: Secondary | ICD-10-CM

## 2015-10-20 DIAGNOSIS — R946 Abnormal results of thyroid function studies: Secondary | ICD-10-CM

## 2015-10-20 LAB — AMB POC URINALYSIS DIP STICK AUTO W/O MICRO
Blood (UA POC): NEGATIVE
Glucose (UA POC): NEGATIVE
Ketones (UA POC): NEGATIVE
Leukocyte esterase (UA POC): NEGATIVE
Nitrites (UA POC): NEGATIVE
Specific gravity (UA POC): 1.03 (ref 1.001–1.035)
Urobilinogen (UA POC): 0.2 (ref 0.2–1)
pH (UA POC): 5.5 (ref 4.6–8.0)

## 2015-10-20 MED ORDER — OMEPRAZOLE 20 MG TAB, DELAYED RELEASE
20 mg | ORAL_TABLET | Freq: Two times a day (BID) | ORAL | 3 refills | Status: DC
Start: 2015-10-20 — End: 2016-02-28

## 2015-10-20 MED ORDER — LOSARTAN 50 MG TAB
50 mg | ORAL_TABLET | Freq: Every day | ORAL | 3 refills | Status: DC
Start: 2015-10-20 — End: 2016-02-28

## 2015-10-20 MED ORDER — NITROFURANTOIN MACROCRYSTAL 100 MG CAP
100 mg | ORAL_CAPSULE | Freq: Two times a day (BID) | ORAL | 0 refills | Status: AC
Start: 2015-10-20 — End: 2015-10-27

## 2015-10-20 MED ORDER — BUPROPION SR 150 MG TAB
150 mg | ORAL_TABLET | ORAL | 3 refills | Status: DC
Start: 2015-10-20 — End: 2016-02-28

## 2015-10-20 NOTE — Telephone Encounter (Signed)
Error

## 2015-10-20 NOTE — Progress Notes (Signed)
1. Have you been to the ER, urgent care clinic since your last visit?  Hospitalized since your last visit?No    2. Have you seen or consulted any other health care providers outside of the [facility] since your last visit?  Include any pap smears or colon screening. pain management    Patient reports frequent urination, especially at night. She finished her abx was prescribed by Dr. Danella Pentoneshmukh.  Patient wants to f/u with [facility] for pain management. I gave her f.u appnt information    March 16th at 11:30am- she is aware of appnt.

## 2015-10-20 NOTE — Progress Notes (Signed)
Sherri Elliott is a 40 y.o.  female and presents with     Chief Complaint   Patient presents with   ??? Hypertension   ??? Thyroid Problem   ??? Nicotine Dependence   ??? Polycythemia   ??? Abdominal Pain   ??? Dysuria       Pt saw hematology and had blood work and was felt that Polycythemia is porbably  due to smoking.  Pt does not drink alcohol. However she does have abdominal pain. She likes spicy food. 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  Pt is seeing pain management for chronic pain.  Pt agrees that blood pressure stays up.      Past Medical History:   Diagnosis Date   ??? Asthma    ??? Musculoskeletal disorder     Chronic back pain     Past Surgical History:   Procedure Laterality Date   ??? HX BUNIONECTOMY     ??? HX COLPOSCOPY     ??? HX GI      hemorroidectomy   ??? HX HEMORRHOIDECTOMY     ??? HX ORTHOPAEDIC      bunionectomy     Current Outpatient Prescriptions   Medication Sig   ??? losartan (COZAAR) 50 mg tablet Take 1 Tab by mouth daily.   ??? nitrofurantoin (MACRODANTIN) 100 mg capsule Take 1 Cap by mouth two (2) times a day for 7 days.   ??? buPROPion SR (WELLBUTRIN SR) 150 mg SR tablet One po daily for 3 days then start taking one tab twice daily   ??? Omeprazole delayed release (PRILOSEC D/R) 20 mg tablet Take 1 Tab by mouth two (2) times a day.   ??? carvedilol (COREG) 12.5 mg tablet Take 1 Tab by mouth two (2) times daily (with meals).   ??? ALPRAZolam (XANAX) 2 mg tablet Take 1 Tab by mouth two (2) times daily as needed for Anxiety. Max Daily Amount: 4 mg.   ??? fentaNYL (DURAGESIC) 50 mcg/hr PATCH 1 Patch by TransDERmal route every seventy-two (72) hours. Max Daily Amount: 1 Patch.   ??? fentaNYL (DURAGESIC) 100 mcg/hr PATCH 1 Patch by TransDERmal route every fourty-eight (48) hours. Max Daily Amount: 1 Patch.   ??? oxyCODONE-acetaminophen (PERCOCET 10) 10-325 mg per tablet Take 1 Tab by mouth every eight (8) hours as needed for Pain. Max Daily Amount: 3 Tabs.    ??? fentaNYL (DURAGESIC) 100 mcg/hr PATCH 1 Patch by TransDERmal route every fourty-eight (48) hours. Max Daily Amount: 1 Patch.   ??? cloNIDine HCl (CATAPRES) 0.1 mg tablet Take 1 Tab by mouth daily as needed. Indications: OPIOID WITHDRAWAL SYMPTOMS   ??? budesonide (PULMICORT) 180 mcg/actuation aepb inhaler Take 2 Puffs by inhalation.   ??? albuterol (PROVENTIL HFA, VENTOLIN HFA) 90 mcg/actuation inhaler Take 2 Puffs by inhalation every four (4) hours as needed.       No current facility-administered medications for this visit.      Health Maintenance   Topic Date Due   ??? Pneumococcal 19-64 Highest Risk (1 of 3 - PCV13) 08/07/1997   ??? DTaP/Tdap/Td series (1 - Tdap) 08/08/1999   ??? PAP AKA CERVICAL CYTOLOGY  08/08/1999   ??? INFLUENZA AGE 49 TO ADULT  Completed       There is no immunization history on file for this patient.  No LMP recorded. Patient is not currently having periods (Reason: Polycystic Ovarian Syndrome).        Allergies and Intolerances:   Allergies  Allergen Reactions   ??? Latex Hives   ??? Aspirin Hives   ??? Banana Hives   ??? Ciprofloxacin Hives   ??? Codeine Hives   ??? Motrin [Ibuprofen] Hives   ??? Toradol [Ketorolac Tromethamine] Hives   ??? Tramadol Hives   ??? Tape [Adhesive] Hives   ??? Venom-Honey Bee Hives       Family History:   No family history on file.    Social History:   She  reports that she has been smoking.  She has been smoking about 0.25 packs per day. She has never used smokeless tobacco.  She  reports that she does not drink alcohol.            Review of Systems:   General: negative for - chills, fatigue, fever, weight change  Psych: negative for - anxiety, depression, irritability or mood swings  ENT: negative for - headaches, hearing change, nasal congestion, oral lesions, sneezing or sore throat  Heme/ Lymph: negative for - bleeding problems, bruising, pallor or swollen lymph nodes  Endo: negative for - hot flashes, polydipsia/polyuria or temperature intolerance   Resp: negative for - cough, shortness of breath or wheezing  CV: negative for - chest pain, edema or palpitations  GI: negative for -  change in bowel habits, constipation, diarrhea or nausea/vomiting, pos for abdominal pain,pos for dysuria  GU: negative for - dysuria, hematuria, incontinence, pelvic pain or vulvar/vaginal symptoms  MSK: negative for - joint pain, joint swelling or muscle pain  Neuro: negative for - confusion, headaches, seizures or weakness  Derm: negative for - dry skin, hair changes, rash or skin lesion changes          Physical:   Vitals:   Vitals:    10/20/15 0921 10/20/15 0922   BP: (!) 145/95 (!) 150/98   Pulse: 80    Resp: 16    Temp: 98.2 ??F (36.8 ??C)    TempSrc: Oral    SpO2: 97%    Weight: 188 lb (85.3 kg)    Height:  (1.6 m)            Exam:   HEENT- atraumatic,normocephalic, awake, oriented, well nourished  Neck - supple,no enlarged lymph nodes, no JVD, no thyromegaly  Chest- CTA, no rhonchi, no crackles  Heart- rrr, no murmurs / gallop/rub  Abdomen- soft,BS+,NT, no hepatosplenomegaly,mild rt sided abdominal tenderness  Ext - no c/c/edema   Neuro- no focal deficits.Power 5/5 all extremities  Skin - warm,dry, no obvious rashes.          Review of Data:   LABS:   Lab Results   Component Value Date/Time    WBC 13.8 10/04/2015 12:15 PM    HGB 13.8 10/04/2015 12:15 PM    HCT 42.0 10/04/2015 12:15 PM    PLATELET 329 10/04/2015 12:15 PM     Lab Results   Component Value Date/Time    Sodium 136 09/27/2015 01:55 PM    Potassium 4.3 09/27/2015 01:55 PM    Chloride 103 09/27/2015 01:55 PM    CO2 24 09/27/2015 01:55 PM    Glucose 95 09/27/2015 01:55 PM    BUN 8 09/27/2015 01:55 PM    Creatinine 0.64 09/27/2015 01:55 PM     Lab Results   Component Value Date/Time    Cholesterol, total 186 08/25/2015 08:54 AM    HDL Cholesterol 47 08/25/2015 08:54 AM    LDL, calculated 111.2 08/25/2015 08:54 AM    Triglyceride 139 08/25/2015 08:54 AM  No results found for: GPT        Impression / Plan:         ICD-10-CM ICD-9-CM    1. Essential hypertension I10 401.9 losartan (COZAAR) 50 mg tablet   2. Frequent urination R35.0 788.41 AMB POC URINALYSIS DIP STICK AUTO W/O MICRO   3. Polycythemia (HCC) D75.1 238.4    4. Goiter E04.9 240.9    5. Urinary tract infection, site unspecified N39.0  nitrofurantoin (MACRODANTIN) 100 mg capsule   6. Smoking F17.200 305.1 buPROPion SR (WELLBUTRIN SR) 150 mg SR tablet   7. Elevated LFTs R79.89 790.6 HEPATITIS C AB      US ABD COMP   8. Pain of upper abdomen R10.10 789.09    9. Other gastritis without hemorrhage K29.60  H PYLORI AG, STOOL      Omeprazole delayed release (PRILOSEC D/R) 20 mg tablet         Explained to patient risk benefits of the medications.Advised patient to stop meds if having any side effects.Pt verbalized understanding of the instructions.    I have discussed the diagnosis with the patient and the intended plan as seen in the above orders.  The patient has received an after-visit summary and questions were answered concerning future plans.  I have discussed medication side effects and warnings with the patient as well. I have reviewed the plan of care with the patient, accepted their input and they are in agreement with the treatment goals.     Reviewed plan of care. Patient has provided input and agrees with goals.    Follow-up Disposition:  Return in about 6 weeks (around 12/01/2015).    Jenene SlickerPravin M Mirel Hundal, MD

## 2015-10-28 ENCOUNTER — Encounter

## 2015-10-28 NOTE — Telephone Encounter (Signed)
Spoke with patient and advised her that the referral to psychiatry is good to take to any provider of her choice so she is able to establish care with any preferred provider. Patient verbalized understanding. She then began to report recommendations from her pain management providers suggesting a referral to physical therapy. Patient advised that she would be contacted with PCP's response to this request.

## 2015-10-28 NOTE — Telephone Encounter (Signed)
Pt calling in to request a new referral for Psychiatry. She has going a couple of times to [facility]. She was taken off her Xanax put on Zolft and had a couple of bad panic attacks/episodes. Is not happy with them. Please advise pt if this can be done.

## 2015-10-28 NOTE — Telephone Encounter (Signed)
Referral done.

## 2015-11-01 ENCOUNTER — Encounter: Attending: Medical Oncology

## 2015-11-09 ENCOUNTER — Inpatient Hospital Stay: Admit: 2015-11-09 | Payer: MEDICARE

## 2015-11-09 ENCOUNTER — Encounter: Attending: Medical Oncology

## 2015-11-09 DIAGNOSIS — M5441 Lumbago with sciatica, right side: Secondary | ICD-10-CM

## 2015-11-09 NOTE — Progress Notes (Addendum)
[facility] ??? 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 She had lumbar fusion in 2012, but unable to recall disc levels other than her lower lumbar.  Reports less pain x 1 year, then s/s returned with radic pain primarily in R LE to foot and bowel and bladder incontinence at timesral.  She has neuropathy and is being seen by a doctor for possible DM.    Max pain = 10/10,aggravagted by bending, sitting, and  transfers of sit to stand, 15 min,  Sitting to 15-20 min durations and pain wakes her at night every 30 minutes,  min pain is 3/10 following meds, rest, heat or cold packs.   Based on findings from eval, the pt is moving very slowly/guarded with a cane in her R hand and is  Forward flexed and R side-bending when standing. She's able to correct in sitting. She has core and  B LE Ms weakness L>R, -SLR B, + B slump test. FOTO=26 and has limited lumbar ROM. Pain cent.  to knee following prone  lying.  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 Pt could benefit from P T interventions to improve mobility, decrease pain, to facilitate ADLs & overall functional status.    =======================================================================  Eval Complexity: History HIGH  Complexity : 3+ comorbidities / personal factors will impact the outcome/ POC ;  Examination  HIGH Complexity : 4+ Standardized tests and measures addressing body structure, function, activity limitation and / or participation in recreation ; Presentation MEDIUM Complexity : Evolving with changing characteristics ;  Decision Making MEDIUM Complexity : FOTO score of 26-74; Overall Complexity MEDIUM  Problem List: pain affecting function, decrease ROM, decrease strength, impaired gait/ balance, decrease ADL/ functional abilitiies, decrease activity tolerance and decrease flexibility/ joint mobility   Treatment Plan may include any combination of the following: Therapeutic exercise, Therapeutic activities, Neuromuscular re-education, Physical agent/modality, Manual therapy, Patient education and Functional mobility training  Patient / Family readiness to learn indicated by: asking questions, trying to perform skills and interest  Persons(s) to be included in education: patient (P)  Barriers to Learning/Limitations: None  Measures taken:    Patient Goal (s): FEEL BETTER AND LOSE WEIGHT   Patient self reported health status: POOR  Rehabilitation Potential:GOOD  ? Short Term Goals: To be accomplished in  4 weeks:    1. Pt to be I with basic HEP to be able to perform ADL's.  2. The patient will have increased HS ROM for hip 90/90 by 10 degrees B to be able  to transfer with less pain.  3. The patient will have decreased pain to a 5/10 max to be able to report ability to sleep without waking > than 2 x per night.  4. The patient will have improved B glut med strength by 1/2 MMT grade for ability to perform ADL's.   5. Improved functional score per FOTO by 10 points to indicate improved functional tolerance.  ? Long Term Goals: To be accomplished in 6-8 weeks:  1. Pt to be I with final HEP to be able to report ability to perform all ADL's.  2. The patient will have increased HS ROM by 15 degrees B hip 90/90 to be able to bend forward to reach objects on the floor without radic pain.  3. The patient will have decreased pain to a 3/10 max to be able to report ability to sleep without waking 2/2 pain.  4. The patient will have improved B LE strength to within functional limits to be able to resume community ambulation.  5. Improved functional score per FOTO  by  20 points to indicate improved functional tolerance.    Frequency / Duration:   Patient to be seen  2  times per week for 6-8 weeks:  Patient / Caregiver education and instruction: self care, activity modification and exercises  GC   Mobility G8978 Current  CL= 60-79%  Mobility:  G8979 Goal  CK= 40-59%. The severity rating is based on the FOTO Score    Therapist Signature: Soledad GerlachLeigh Ann Taevion Sikora, PT Date: 11/09/15   Certification Period: 11/09/15 TO 02/08/16 Time: 11:50AM   ===================================================================  I certify that the above Physical Therapy Services are being furnished while the patient is under my care.  I agree with the treatment plan and certify that this therapy is necessary.    Physician Signature:        Date:       Time:      Please sign and return to [facility] or you may fax the signed copy to [phone].  Thank you.

## 2015-11-09 NOTE — Progress Notes (Signed)
PHYSICAL THERAPY - DAILY TREATMENT NOTE  8-14    Patient Name: Sherri Elliott        Date: 11/09/2015  DOB: 11-30-1975   YES Patient DOB Verified  Visit #:   1   of   12  Insurance: Payor: [place] MED[place]ARE / Plan: [place] MEDICARE PART A & B / Product Type: Medicare /      In time: 9326 Out time: 1135   Total Treatment Time: 58     Medicare Time Tracking (below)   Total Timed Codes (min):  58 1:1 Treatment Time:  58     TREATMENT AREA =  LUMBAR RADIC    SUBJECTIVE    Pain Level (on 0 to 10 scale):  8  / 10   Medication Changes/New allergies or changes in medical history, any new surgeries or procedures?    NO    If yes, update Summary List   Subjective Functional Status/Changes:    No changes reported     See IE       OBJECTIVE      10 min Manual Therapy: Technique:       S/DTM IASTM PROM  Passive Stretching   manual TPR    Jt manipulation:Gr I  II   III  IV V  Treatment Area:  Lumbar paraspinals, R QL.  MET to correct L on R sacral torsion   Rationale:      decrease pain, increase ROM and increase tissue extensibility to improve patient's ability to stand/walk with manageable pain.           min Patient Education:  YES  Reviewed HEP     Progressed/Changed HEP based on:   Instructed to use ice, prone lying, ed on spine and disc pathology.     Other Objective/Functional Measures:    See eval     Post Treatment Pain Level (on 0 to 10) scale:   6  / 10     ASSESSMENT    Assessment/Changes in Function:     Pain was centralized to R knee post PT       See Progress Note/Recertification   Patient will continue to benefit from skilled PT services to modify and progress therapeutic interventions, address functional mobility deficits, address ROM deficits, address strength deficits, analyze and address soft tissue restrictions, analyze and cue movement patterns and analyze and modify body mechanics/ergonomics to attain remaining goals.  to attain remaining goals.   Progress toward goals / Updated goals:    See POC     PLAN       Upgrade activities as tolerated YES Continue plan of care     Discharge due to :      Other:      Therapist: Darius Bump Trystan Eads, PT    Date: 11/09/2015 Time: 1:17 PM   Future Appointments  Date Time Provider [place]   11/15/2015 9:30 AM Maryanna Shape, PTA [facility]   11/18/2015 9:00 AM Maryanna Shape, PTA [facility]   11/21/2015 2:00 PM Dorien Chihuahua, MD [place]   11/23/2015 10:30 AM [place] PT AMELIA 1 [facility] [place]   11/25/2015 10:00 AM Maryanna Shape, PTA [facility]   11/28/2015 9:00 AM Maryanna Shape, PTA [facility]   11/30/2015 9:30 AM Maryanna Shape, PTA [facility]   12/01/2015 9:30 AM Rennie Natter Lin Landsman, MD [facility]   12/05/2015 9:30 AM Maryanna Shape, PTA [facility]   12/08/2015 9:30 AM Maryanna Shape, PTA [facility]  12/13/2015 10:00 AM Maryanna Shape, PTA [facility]   12/15/2015 9:30 AM Maryanna Shape, PTA [facility]   12/19/2015 9:30 AM Maryanna Shape, PTA [facility]

## 2015-11-09 NOTE — Progress Notes (Signed)
[place] ??? [facility] PHYSICAL THERAPY  [address] [place] [address] - Phone: [phone]  Fax: [phone]  [place]          Patient Name: Sherri Elliott DOB: June 27, 1976   Treatment/Medical Diagnosis: Lumbago with sciatica, right side [M54.41]   Onset Date: 2003    Referral Source: Elnora Morrison, MD Start of Care [facility]): 11/29/2015   Prior Hospitalization: See Medical History Provider #: [phone]   Prior Level of Function: Independent and painfree/painfull ADLs, IADLs, and recreational activity   Comorbidities: Fibromyalgia, depression, arthritis, thyroid, HTN, latex allergy, asthma,  scoliosis, neuropathy, possible DM   Medications: Verified on Patient Summary List   Visits from [facility]: 1 Missed Visits: 3     Goal/Measure of Progress Goal Met?   1.  Pt to be I with basic HEP to be able to perform ADL's   Status at last Eval: Goal Established Current Status: Unable to be assessed no   2.  The patient will have increased HS ROM for hip 90/90 by 10 degrees B to be able to transfer with less pain.   Status at last Eval: R ham ROM = 155  L Ham ROM = 160 Current Status: Unable to be assessed no   3.  The patient will have decreased pain to a 5/10 max to be able to report ability to sleep without waking > than 2 x per night.   Status at last Eval: 10/10 max pain Current Status: Unable to be assessed no   4.  The patient will have improved B glut med strength by 1/2 MMT grade for ability to perform ADL's.   Status at last Eval: BIL hip ABD strength = 4/5 Current Status: Unable to be assessed no   5. Improved functional score per FOTO by 10 points to indicate improved functional tolerance. 26/100 Current Status: Unable to be assessed no     Key Functional Changes/Progress: Unable to be formally assessed secondary to pt not returning to PT after Nov 29, 2015 visit.     G-Codes (GP): n/a   Assessments/Recommendations: Discontinue therapy due to lack of attendance or compliance.  If you have any questions/comments please contact us directly at  [phone].  Thank you for allowing [place] to assist in the care of your patient.    Therapist Signature: Jake Church Date: 11/29/15   Reporting Period: November 29, 2015 to 2015-11-29 Time: 12:48 PM

## 2015-11-21 ENCOUNTER — Encounter: Attending: Medical Oncology

## 2015-11-25 ENCOUNTER — Inpatient Hospital Stay: Payer: MEDICARE

## 2015-11-25 ENCOUNTER — Encounter: Attending: Medical Oncology

## 2015-12-01 ENCOUNTER — Ambulatory Visit: Attending: Internal Medicine

## 2015-12-01 NOTE — Progress Notes (Signed)
Sherri Elliott is a 40 y.o.  female and presents with     Chief Complaint   Patient presents with   ??? Medication Refill   ??? Hypertension   ??? Polycythemia       Pt says she is not ahppy with pain management and the psychologist.  Pt has not doen her labs.  Pt was placed on Zoloft and later Seroquil.  Pt says she started to hallucinate.  Pt decided to stop seeing Psycholigist.        Past Medical History:   Diagnosis Date   ??? Asthma    ??? Musculoskeletal disorder     Chronic back pain     Past Surgical History:   Procedure Laterality Date   ??? HX BUNIONECTOMY     ??? HX COLPOSCOPY     ??? HX GI      hemorroidectomy   ??? HX HEMORRHOIDECTOMY     ??? HX ORTHOPAEDIC      bunionectomy     Current Outpatient Prescriptions   Medication Sig   ??? losartan (COZAAR) 50 mg tablet Take 1 Tab by mouth daily.   ??? buPROPion SR (WELLBUTRIN SR) 150 mg SR tablet One po daily for 3 days then start taking one tab twice daily   ??? Omeprazole delayed release (PRILOSEC D/R) 20 mg tablet Take 1 Tab by mouth two (2) times a day.   ??? carvedilol (COREG) 12.5 mg tablet Take 1 Tab by mouth two (2) times daily (with meals).   ??? ALPRAZolam (XANAX) 2 mg tablet Take 1 Tab by mouth two (2) times daily as needed for Anxiety. Max Daily Amount: 4 mg.   ??? fentaNYL (DURAGESIC) 50 mcg/hr PATCH 1 Patch by TransDERmal route every seventy-two (72) hours. Max Daily Amount: 1 Patch.   ??? fentaNYL (DURAGESIC) 100 mcg/hr PATCH 1 Patch by TransDERmal route every fourty-eight (48) hours. Max Daily Amount: 1 Patch.   ??? oxyCODONE-acetaminophen (PERCOCET 10) 10-325 mg per tablet Take 1 Tab by mouth every eight (8) hours as needed for Pain. Max Daily Amount: 3 Tabs.   ??? fentaNYL (DURAGESIC) 100 mcg/hr PATCH 1 Patch by TransDERmal route every fourty-eight (48) hours. Max Daily Amount: 1 Patch.   ??? cloNIDine HCl (CATAPRES) 0.1 mg tablet Take 1 Tab by mouth daily as needed. Indications: OPIOID WITHDRAWAL SYMPTOMS    ??? budesonide (PULMICORT) 180 mcg/actuation aepb inhaler Take 2 Puffs by inhalation.   ??? albuterol (PROVENTIL HFA, VENTOLIN HFA) 90 mcg/actuation inhaler Take 2 Puffs by inhalation every four (4) hours as needed.       No current facility-administered medications for this visit.      Health Maintenance   Topic Date Due   ??? Pneumococcal 19-64 Highest Risk (1 of 3 - PCV13) 08/07/1997   ??? DTaP/Tdap/Td series (1 - Tdap) 08/08/1999   ??? PAP AKA CERVICAL CYTOLOGY  08/08/1999   ??? INFLUENZA AGE 63 TO ADULT  Completed       There is no immunization history on file for this patient.  No LMP recorded. Patient is not currently having periods (Reason: Polycystic Ovarian Syndrome).        Allergies and Intolerances:   Allergies   Allergen Reactions   ??? Latex Hives   ??? Aspirin Hives   ??? Banana Hives   ??? Ciprofloxacin Hives   ??? Codeine Hives   ??? Motrin [Ibuprofen] Hives   ??? Toradol [Ketorolac Tromethamine] Hives   ??? Tramadol Hives   ??? Tape [Adhesive] Hives   ???  Venom-Honey Bee Hives       Family History:   History reviewed. No pertinent family history.    Social History:   She  reports that she has been smoking.  She has been smoking about 0.25 packs per day. She has never used smokeless tobacco.  She  reports that she does not drink alcohol.            Review of Systems:   General: negative for - chills, fatigue, fever, weight change  Psych: positive for - anxiety, depression, irritability or mood swings  ENT: negative for - headaches, hearing change, nasal congestion, oral lesions, sneezing or sore throat  Heme/ Lymph: negative for - bleeding problems, bruising, pallor or swollen lymph nodes  Endo: negative for - hot flashes, polydipsia/polyuria or temperature intolerance  Resp: negative for - cough, shortness of breath or wheezing  CV: negative for - chest pain, edema or palpitations  GI: negative for - abdominal pain, change in bowel habits, constipation, diarrhea or nausea/vomiting   GU: negative for - dysuria, hematuria, incontinence, pelvic pain or vulvar/vaginal symptoms  MSK: negative for - joint pain, joint swelling or muscle pain  Neuro: negative for - confusion, headaches, seizures or weakness  Derm: negative for - dry skin, hair changes, rash or skin lesion changes  Pos for chronic back pain          Physical:   Vitals:   Vitals:    12/01/15 0950   BP: (!) 155/98   Pulse: 98   Resp: 18   Temp: 97.4 ??F (36.3 ??C)   TempSrc: Oral   SpO2: 99%   Weight: 187 lb 9.6 oz (85.1 kg)   Height:  (1.6 m)           Exam:   HEENT- atraumatic,normocephalic, awake, oriented, well nourished  Neck - supple,no enlarged lymph nodes, no JVD, no thyromegaly  Chest- CTA, no rhonchi, no crackles  Heart- rrr, no murmurs / gallop/rub  Abdomen- soft,BS+,NT, no hepatosplenomegaly  Ext - no c/c/edema   Neuro- no focal deficits.Power 5/5 all extremities  Skin - warm,dry, no obvious rashes.          Review of Data:   LABS:   Lab Results   Component Value Date/Time    WBC 13.8 10/04/2015 12:15 PM    HGB 13.8 10/04/2015 12:15 PM    HCT 42.0 10/04/2015 12:15 PM    PLATELET 329 10/04/2015 12:15 PM     Lab Results   Component Value Date/Time    Sodium 136 09/27/2015 01:55 PM    Potassium 4.3 09/27/2015 01:55 PM    Chloride 103 09/27/2015 01:55 PM    CO2 24 09/27/2015 01:55 PM    Glucose 95 09/27/2015 01:55 PM    BUN 8 09/27/2015 01:55 PM    Creatinine 0.64 09/27/2015 01:55 PM     Lab Results   Component Value Date/Time    Cholesterol, total 186 08/25/2015 08:54 AM    HDL Cholesterol 47 08/25/2015 08:54 AM    LDL, calculated 111.2 08/25/2015 08:54 AM    Triglyceride 139 08/25/2015 08:54 AM     No results found for: GPT        Impression / Plan:        ICD-10-CM ICD-9-CM    1. Essential hypertension I10 401.9    2. 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 Bipolar  affective disorder, current episode depressed, current episode  severity unspecified (HCC) F31.30 296.50      Gave pt list of pain management specialist in the area.    Pt also [facility] and was referred to Orthopedics.      Elevated LFTs - ultrasound abdomen and blod work.      Explained to patient risk benefits of the medications.Advised patient to stop meds if having any side effects.Pt verbalized understanding of the instructions.    I have discussed the diagnosis with the patient and the intended plan as seen in the above orders.  The patient has received an after-visit summary and questions were answered concerning future plans.  I have discussed medication side effects and warnings with the patient as well. I have reviewed the plan of care with the patient, accepted their input and they are in agreement with the treatment goals.     Reviewed plan of care. Patient has provided input and agrees with goals.    Follow-up Disposition: Not on File    Jenene SlickerPravin M Maja Mccaffery, MD

## 2015-12-01 NOTE — Progress Notes (Signed)
Chief Complaint   Patient presents with   ??? Medication Refill     1. Have you been to the ER, urgent care clinic since your last visit?  Hospitalized since your last visit?No    2. Have you seen or consulted any other health care providers outside of the [facility] since your last visit?  Include any pap smears or colon screening. Yes pain management

## 2015-12-08 ENCOUNTER — Encounter: Attending: Medical Oncology

## 2016-02-28 ENCOUNTER — Ambulatory Visit: Admit: 2016-02-28 | Discharge: 2016-02-28 | Payer: MEDICARE | Attending: Internal Medicine

## 2016-02-28 DIAGNOSIS — G8929 Other chronic pain: Secondary | ICD-10-CM

## 2016-02-28 NOTE — Progress Notes (Signed)
Sherri Elliott is a 40 y.o.  female and presents with     Chief Complaint   Patient presents with   ??? Hypertension   ??? Pain (Chronic)     back and behind right leg   ??? Medication Refill     controlled medications     Pt was dismissed by [place] pain management. Pt has chronic back pain. Pt saw DR Dyrd in the past but does not want to see him again.  Pt says she had spinal fusion in 2010.   Pt stopped talking all her meds. She is not taking blood pressure meds. She still smokes. She saw Dr Daryl Eastern who felt polycythemia was from smoking.  Pt says she has tried Lyrica, Gabapentin, Lidocaine patches. Nothing helps.      Past Medical History:   Diagnosis Date   ??? Asthma    ??? Musculoskeletal disorder     Chronic back pain     Past Surgical History:   Procedure Laterality Date   ??? HX BUNIONECTOMY     ??? HX COLPOSCOPY     ??? HX GI      hemorroidectomy   ??? HX HEMORRHOIDECTOMY     ??? HX ORTHOPAEDIC      bunionectomy     Current Outpatient Prescriptions   Medication Sig   ??? ALPRAZolam (XANAX) 2 mg tablet Take 1 Tab by mouth two (2) times daily as needed for Anxiety. Max Daily Amount: 4 mg.   ??? budesonide (PULMICORT) 180 mcg/actuation aepb inhaler Take 2 Puffs by inhalation.   ??? albuterol (PROVENTIL HFA, VENTOLIN HFA) 90 mcg/actuation inhaler Take 2 Puffs by inhalation every four (4) hours as needed.       No current facility-administered medications for this visit.      Health Maintenance   Topic Date Due   ??? Pneumococcal 19-64 Highest Risk (1 of 3 - PCV13) 08/07/1997   ??? DTaP/Tdap/Td series (1 - Tdap) 08/08/1999   ??? PAP AKA CERVICAL CYTOLOGY  08/08/1999   ??? INFLUENZA AGE 75 TO ADULT  03/13/2016       There is no immunization history on file for this patient.  No LMP recorded. Patient is not currently having periods (Reason: Polycystic Ovarian Syndrome).        Allergies and Intolerances:   Allergies   Allergen Reactions   ??? Latex Hives   ??? Aspirin Hives   ??? Banana Hives   ??? Ciprofloxacin Hives   ??? Codeine Hives    ??? Motrin [Ibuprofen] Hives   ??? Toradol [Ketorolac Tromethamine] Hives   ??? Tramadol Hives   ??? Tape [Adhesive] Hives   ??? Venom-Honey Bee Hives       Family History:   History reviewed. No pertinent family history.    Social History:   She  reports that she has been smoking.  She has been smoking about 0.25 packs per day. She has never used smokeless tobacco.  She  reports that she does not drink alcohol.            Review of Systems:   General: negative for - chills, fatigue, fever, weight change  Psych: negative for - anxiety, depression, irritability or mood swings  ENT: negative for - headaches, hearing change, nasal congestion, oral lesions, sneezing or sore throat  Heme/ Lymph: negative for - bleeding problems, bruising, pallor or swollen lymph nodes  Endo: negative for - hot flashes, polydipsia/polyuria or temperature intolerance  Resp: negative for - cough, shortness  of breath or wheezing  CV: negative for - chest pain, edema or palpitations  GI: negative for - abdominal pain, change in bowel habits, constipation, diarrhea or nausea/vomiting  GU: negative for - dysuria, hematuria, incontinence, pelvic pain or vulvar/vaginal symptoms  MSK: negative for - joint pain, joint swelling or muscle pain, pos for back pain  Neuro: negative for - confusion, headaches, seizures or weakness  Derm: negative for - dry skin, hair changes, rash or skin lesion changes          Physical:   Vitals:   Vitals:    02/28/16 1039   BP: (!) 139/92   Pulse: 95   Resp: 20   Temp: 97.4 ??F (36.3 ??C)   TempSrc: Oral   SpO2: 99%   Weight: 174 lb (78.9 kg)   Height: 5\' 3"  (1.6 m)           Exam:   HEENT- atraumatic,normocephalic, awake, oriented, well nourished  Neck - supple,no enlarged lymph nodes, no JVD, no thyromegaly  Chest- CTA, no rhonchi, no crackles  Heart- rrr, no murmurs / gallop/rub  Abdomen- soft,BS+,NT, no hepatosplenomegaly  Ext - no c/c/edema   Neuro- no focal deficits.Power 5/5 all extremities   Skin - warm,dry, no obvious rashes.          Review of Data:   LABS:   Lab Results   Component Value Date/Time    WBC 13.8 10/04/2015 12:15 PM    HGB 13.8 10/04/2015 12:15 PM    HCT 42.0 10/04/2015 12:15 PM    PLATELET 329 10/04/2015 12:15 PM     Lab Results   Component Value Date/Time    Sodium 136 09/27/2015 01:55 PM    Potassium 4.3 09/27/2015 01:55 PM    Chloride 103 09/27/2015 01:55 PM    CO2 24 09/27/2015 01:55 PM    Glucose 95 09/27/2015 01:55 PM    BUN 8 09/27/2015 01:55 PM    Creatinine 0.64 09/27/2015 01:55 PM     Lab Results   Component Value Date/Time    Cholesterol, total 186 08/25/2015 08:54 AM    HDL Cholesterol 47 08/25/2015 08:54 AM    LDL, calculated 111.2 08/25/2015 08:54 AM    Triglyceride 139 08/25/2015 08:54 AM     No results found for: GPT        Impression / Plan:        ICD-10-CM ICD-9-CM    1. Chronic midline back pain, unspecified back location M54.9 724.5 REFERRAL TO ORTHOPEDIC SURGERY    G89.29 338.29 MRI LUMB SPINE W WO CONT   2. Essential hypertension I10 401.9        advsied pt - to stop smoking      Informed pt that I am not comfortable prescribing opiods.  She declined NSAIDS, local applications    Explained to patient risk benefits of the medications.Advised patient to stop meds if having any side effects.Pt verbalized understanding of the instructions.    I have discussed the diagnosis with the patient and the intended plan as seen in the above orders.  The patient has received an after-visit summary and questions were answered concerning future plans.  I have discussed medication side effects and warnings with the patient as well. I have reviewed the plan of care with the patient, accepted their input and they are in agreement with the treatment goals.     Reviewed plan of care. Patient has provided input and agrees with goals.    Follow-up Disposition: Not  on File    Elnora Morrison, MD

## 2016-02-28 NOTE — Progress Notes (Signed)
Chief Complaint   Patient presents with   ??? Hypertension   ??? Pain (Chronic)     back and behind right leg   ??? Medication Refill     controlled medications     1. Have you been to the ER, urgent care clinic since your last visit?  Hospitalized since your last visit?Yes tn    2. Have you seen or consulted any other health care providers outside of the [facility] since your last visit?  Include any pap smears or colon screening. No

## 2016-04-25 ENCOUNTER — Inpatient Hospital Stay
Admit: 2016-04-25 | Discharge: 2016-04-26 | Disposition: A | Discharge status: Home / Self Care | Payer: MEDICARE | Attending: Emergency Medicine

## 2016-04-25 ENCOUNTER — Inpatient Hospital Stay
Admit: 2016-04-25 | Discharge: 2016-04-25 | Disposition: A | Discharge status: Home / Self Care | Payer: MEDICARE | Attending: Emergency Medicine

## 2016-04-25 DIAGNOSIS — M545 Low back pain, unspecified: Secondary | ICD-10-CM

## 2016-04-25 LAB — URINALYSIS W/ RFLX MICROSCOPIC
Bilirubin: NEGATIVE
Blood: NEGATIVE
Glucose: NEGATIVE mg/dL
Ketone: NEGATIVE mg/dL
Nitrites: NEGATIVE
Protein: NEGATIVE mg/dL
Specific gravity: 1.018 (ref 1.005–1.030)
Urobilinogen: 0.2 EU/dL (ref 0.2–1.0)
pH (UA): 5.5 (ref 5.0–8.0)

## 2016-04-25 LAB — URINE MICROSCOPIC ONLY
RBC: 0 /hpf (ref 0–5)
WBC: 0 /hpf (ref 0–4)

## 2016-04-25 LAB — HCG URINE, QL: HCG urine, QL: NEGATIVE

## 2016-04-25 MED ORDER — CYCLOBENZAPRINE 5 MG TAB
5 mg | ORAL_TABLET | Freq: Every evening | ORAL | 0 refills | Status: DC
Start: 2016-04-25 — End: 2016-05-18

## 2016-04-25 MED ORDER — NITROFURANTOIN (25% MACROCRYSTAL FORM) 100 MG CAP
100 mg | ORAL_CAPSULE | Freq: Two times a day (BID) | ORAL | 0 refills | Status: AC
Start: 2016-04-25 — End: 2016-04-28

## 2016-04-25 NOTE — ED Triage Notes (Signed)
"  My lower back is hurting."

## 2016-04-25 NOTE — ED Triage Notes (Signed)
Pt seen for back pain and left prior to d/c.  Pt now checking in for back pain again with chest pain that "just started 5 minutes ago."

## 2016-04-25 NOTE — ED Provider Notes (Signed)
HPI Comments: Sherri Elliott is a 40 y.o. female that presents to the ED with a complaint of chronic back pain, abdominal pain and urinary frequency.  PT states that she has been diagnosed with a disc herniation in the past but has not been able to follow upiwht Ortho/Spine.  She states that it acts up every once and a while but this time it seems to radiate around to her belly.  Pt states that she has been going to the bathroom more often the past few days as well.  DEnies fever, HA, bowel changes, urinary incontinence, paresthesia, anesthesia        Patient is a 40 y.o. female presenting with back pain.   Back Pain    Associated symptoms include abdominal pain and dysuria. Pertinent negatives include no chest pain, no fever and no headaches.        Past Medical History:   Diagnosis Date   ??? Asthma    ??? Musculoskeletal disorder     Chronic back pain       Past Surgical History:   Procedure Laterality Date   ??? HX BUNIONECTOMY     ??? HX COLPOSCOPY     ??? HX GI      hemorroidectomy   ??? HX HEMORRHOIDECTOMY     ??? HX ORTHOPAEDIC      bunionectomy         History reviewed. No pertinent family history.    Social History     Social History   ??? Marital status: SINGLE     Spouse name: N/A   ??? Number of children: N/A   ??? Years of education: N/A     Occupational History   ??? Not on file.     Social History Main Topics   ??? Smoking status: Current Every Day Smoker     Packs/day: 0.25   ??? Smokeless tobacco: Never Used   ??? Alcohol use No   ??? Drug use: No   ??? Sexual activity: Not Currently     Other Topics Concern   ??? Not on file     Social History Narrative         ALLERGIES: Latex; Aspirin; Banana; Ciprofloxacin; Codeine; Motrin [ibuprofen]; Toradol [ketorolac tromethamine]; Tramadol; Tape [adhesive]; and Venom-honey bee    Review of Systems   Constitutional: Negative for fatigue and fever.   Respiratory: Negative for cough.    Cardiovascular: Negative for chest pain.    Gastrointestinal: Positive for abdominal pain. Negative for diarrhea, nausea and vomiting.   Genitourinary: Positive for dysuria and frequency.   Musculoskeletal: Positive for back pain.   Neurological: Negative for dizziness and headaches.       Vitals:    04/25/16 0916   BP: (!) 135/95   Pulse: 70   Resp: 16   Temp: 98.9 ??F (37.2 ??C)   SpO2: 100%   Weight: 77.1 kg (170 lb)   Height: 5\' 3"  (1.6 m)            Physical Exam   Constitutional: She is oriented to person, place, and time. She appears well-developed and well-nourished. No distress.   Pt ambulates with cane.   HENT:   Head: Normocephalic and atraumatic.   Nose: Nose normal.   Eyes: Conjunctivae are normal. Pupils are equal, round, and reactive to light.   Neck: Normal range of motion. Neck supple.   Cardiovascular: Normal rate, regular rhythm and normal heart sounds.    Pulmonary/Chest: Effort normal and breath sounds  normal. No respiratory distress.   Abdominal: Normal appearance. There is tenderness in the suprapubic area and left lower quadrant. There is no rigidity, no rebound, no guarding, no CVA tenderness, no tenderness at McBurney's point and negative Murphy's sign.   Musculoskeletal: Normal range of motion.   Neurological: She is alert and oriented to person, place, and time.   Skin: Skin is warm and dry.   Psychiatric: She has a normal mood and affect. Her behavior is normal.   Vitals reviewed.       MDM  Number of Diagnoses or Management Options  Diagnosis management comments: Labs Reviewed  URINALYSIS W/ RFLX MICROSCOPIC - Abnormal; Notable for the following:      Leukocyte Esterase            TRACE (*)            All other components within normal limits  URINE MICROSCOPIC ONLY - Abnormal; Notable for the following:      Bacteria                      3+ (*)                 Mucus                         4+ (*)              All other components within normal limits  HCG URINE, QL    Impression: UTI, chronic back pain    Plan: discharge home   Antibiotic prescription, muscle relaxant and anti inflammatory medications  Increase fluids  Follow up with PCP  Follow up with Ortho       Amount and/or Complexity of Data Reviewed  Clinical lab tests: ordered and reviewed  Tests in the medicine section of CPT??: ordered and reviewed    Risk of Complications, Morbidity, and/or Mortality  Presenting problems: moderate  Diagnostic procedures: moderate  Management options: moderate    Patient Progress  Patient progress: stable    ED Course       Procedures           Vitals:  Patient Vitals for the past 12 hrs:   Temp Pulse Resp BP SpO2   04/25/16 0916 98.9 ??F (37.2 ??C) 70 16 (!) 135/95 100 %         Medications ordered:   Medications - No data to display      Lab findings:  Recent Results (from the past 12 hour(s))   URINALYSIS W/ RFLX MICROSCOPIC    Collection Time: 04/25/16  9:44 AM   Result Value Ref Range    Color YELLOW      Appearance CLOUDY      Specific gravity 1.018 1.005 - 1.030      pH (UA) 5.5 5.0 - 8.0      Protein NEGATIVE  NEG mg/dL    Glucose NEGATIVE  NEG mg/dL    Ketone NEGATIVE  NEG mg/dL    Bilirubin NEGATIVE  NEG      Blood NEGATIVE  NEG      Urobilinogen 0.2 0.2 - 1.0 EU/dL    Nitrites NEGATIVE  NEG      Leukocyte Esterase TRACE (A) NEG     HCG URINE, QL    Collection Time: 04/25/16  9:44 AM   Result Value Ref Range    HCG urine, Ql. NEGATIVE  NEG  URINE MICROSCOPIC ONLY    Collection Time: 04/25/16  9:44 AM   Result Value Ref Range    WBC 0 to 3 0 - 4 /hpf    RBC 0 0 - 5 /hpf    Epithelial cells 3+ 0 - 5 /lpf    Bacteria 3+ (A) NEG /hpf    Mucus 4+ (A) NEG /lpf           X-Ray, CT or other radiology findings or impressions:  No orders to display       Progress notes, Consult notes or additional Procedure notes:       Disposition:  Diagnosis: No diagnosis found.    Disposition: discharge    Follow-up Information     None           Patient's Medications   Start Taking    No medications on file   Continue Taking     ALBUTEROL (PROVENTIL HFA, VENTOLIN HFA) 90 MCG/ACTUATION INHALER    Take 2 Puffs by inhalation every four (4) hours as needed.      BUDESONIDE (PULMICORT) 180 MCG/ACTUATION AEPB INHALER    Take 2 Puffs by inhalation.    CARVEDILOL PO    Take  by mouth.   These Medications have changed    No medications on file   Stop Taking    ALPRAZOLAM (XANAX) 2 MG TABLET    Take 1 Tab by mouth two (2) times daily as needed for Anxiety. Max Daily Amount: 4 mg.

## 2016-04-25 NOTE — ED Provider Notes (Signed)
HPI [id number] YOF here for multiple complaints. She says she was here earlier and left prior to discharge. She says she took one macrobid her mother had today after she left here for a UTI diagnosed here.  She says that she has had intermittent chest pain earlier this evening a few hours ago and that lasted for seconds. She says the pain was in the middle of her chest. Next, she says that she has had some mild abdominal pain, generalized for 2 days, and didn't mention that earlier today. Finally, she says she has chronic low back pain and tylenol is not helping that and she needs something stronger.  She denies syncope, leg pains, headache, neck pain, palpitations or SOB.      Past Medical History:   Diagnosis Date   ??? Asthma    ??? Musculoskeletal disorder     Chronic back pain       Past Surgical History:   Procedure Laterality Date   ??? HX BUNIONECTOMY     ??? HX COLPOSCOPY     ??? HX GI      hemorroidectomy   ??? HX HEMORRHOIDECTOMY     ??? HX ORTHOPAEDIC      bunionectomy         History reviewed. No pertinent family history.    Social History     Social History   ??? Marital status: SINGLE     Spouse name: N/A   ??? Number of children: N/A   ??? Years of education: N/A     Occupational History   ??? Not on file.     Social History Main Topics   ??? Smoking status: Current Every Day Smoker     Packs/day: 0.25   ??? Smokeless tobacco: Never Used   ??? Alcohol use No   ??? Drug use: No   ??? Sexual activity: Not Currently     Other Topics Concern   ??? Not on file     Social History Narrative         ALLERGIES: Latex; Aspirin; Banana; Ciprofloxacin; Codeine; Motrin [ibuprofen]; Toradol [ketorolac tromethamine]; Tramadol; Tape [adhesive]; and Venom-honey bee    Review of Systems   Constitutional: Negative.    HENT: Negative.    Eyes: Negative.    Respiratory: Negative.    Gastrointestinal: Positive for abdominal pain. Negative for abdominal distention, anal bleeding, blood in stool, constipation, diarrhea, nausea, rectal pain and vomiting.    Genitourinary: Negative.    Musculoskeletal: Positive for back pain. Negative for arthralgias, gait problem, joint swelling, myalgias, neck pain and neck stiffness.   Skin: Negative.    Neurological: Negative.    Psychiatric/Behavioral: Negative for confusion.   All other systems reviewed and are negative.      Vitals:    04/25/16 1929 04/25/16 2130 04/25/16 2133   BP: (!) 133/92 (!) 122/94    Pulse: (!) 112     Resp: 14     Temp: 98.1 ??F (36.7 ??C)     SpO2: 100%  100%   Weight: 77.1 kg (170 lb)              Physical Exam   Constitutional: She is oriented to person, place, and time. She appears well-developed and well-nourished. No distress.   HENT:   Head: Normocephalic and atraumatic.   Right Ear: External ear normal.   Left Ear: External ear normal.   Nose: Nose normal.   Mouth/Throat: Oropharynx is clear and moist.   Eyes: Conjunctivae and EOM  are normal. Pupils are equal, round, and reactive to light.   Neck: Normal range of motion. Neck supple.   Cardiovascular: Normal rate, regular rhythm, normal heart sounds and intact distal pulses.    Pulmonary/Chest: Effort normal and breath sounds normal.   Abdominal: Soft. Bowel sounds are normal. She exhibits no distension. There is tenderness. There is no rebound and no guarding.       Musculoskeletal: Normal range of motion. She exhibits no edema or tenderness.        Arms:  Lymphadenopathy:     She has no cervical adenopathy.   Neurological: She is alert and oriented to person, place, and time.   Skin: Skin is warm and dry. No rash noted. She is not diaphoretic. No erythema. No pallor.   Psychiatric: She has a normal mood and affect.   Nursing note and vitals reviewed.       MDM  Number of Diagnoses or Management Options  Abdominal discomfort, generalized:   Chronic bilateral low back pain without sciatica:   Elevated blood pressure reading:   Diagnosis management comments: Discussed differentials with pt, she was  here earlier for her chronic low back pain and UTI.  She is a smoker, otherwise no risks for PE.   [place] 29 narcotic and benzo Rx in the last year, most recent narcotics 03/31/16, no clinical indication so far for opiates, basic workup started in the ED for her complaints. Similar leukocytosis at [facility] 04/16/16, CT abd/pelvis negative, besides she does not present as an acute abdomen today.  Also, HR down to 84 here in the ED.   Recheck: 2220pm: I went over lab results with the patient, abdomen soft, non-tender, no need for more ER workup, she says she has not received any pain medications recently and has no physicians who prescribe these, her PMP says otherwise, no clinical indication to write for these in the ED today.  I discussed this with the patient.  I will give her outpatient follow up, no emergent conditions today.         Amount and/or Complexity of Data Reviewed  Clinical lab tests: ordered and reviewed    Risk of Complications, Morbidity, and/or Mortality  Presenting problems: low  Diagnostic procedures: low  Management options: low    Patient Progress  Patient progress: stable (Improved. )    ED Course       Procedures         Labs Reviewed   METABOLIC PANEL, COMPREHENSIVE - Abnormal; Notable for the following:        Result Value    Glucose 108 (*)     BUN/Creatinine ratio 21 (*)     All other components within normal limits   CBC WITH AUTOMATED DIFF - Abnormal; Notable for the following:     WBC 16.3 (*)     RDW 15.5 (*)     ABS. NEUTROPHILS 9.9 (*)     ABS. LYMPHOCYTES 4.7 (*)     ABS. MONOCYTES 1.5 (*)     All other components within normal limits   LIPASE - Abnormal; Notable for the following:     Lipase 63 (*)     All other components within normal limits   CARDIAC PANEL,(CK, CKMB & TROPONIN)       ICD-10-CM ICD-9-CM   1. Chronic bilateral low back pain without sciatica M54.5 724.2    G89.29 338.29   2. Elevated blood pressure reading R03.0 796.2    3. Abdominal  discomfort, generalized R10.84 789.07       Plan: Discharge to home stable, continue her medications from earlier as prescribed, see her primary care physician in 2 days, ortho referral given, return here for any concerns or worsening.-

## 2016-04-25 NOTE — ED Notes (Signed)
Pt left with discharged instructions and rx times 2.  Pt called and message left to call back

## 2016-04-25 NOTE — ED Notes (Signed)
Pt state she has to leave for an emergency and will return once her urine results are back.  Pt was already examined by provider and awaiting urine specimen

## 2016-04-25 NOTE — ED Notes (Signed)
I have reviewed discharge instructions with the patient.  The patient verbalized understanding. Patient armband removed and shredded. VSS. Patient ambulatory upon discharge in NAD

## 2016-04-26 LAB — EKG, 12 LEAD, INITIAL
Atrial Rate: 101 {beats}/min
Calculated P Axis: 67 degrees
Calculated R Axis: 22 degrees
Calculated T Axis: 32 degrees
P-R Interval: 152 ms
Q-T Interval: 356 ms
QRS Duration: 86 ms
QTC Calculation (Bezet): 461 ms
Ventricular Rate: 101 {beats}/min

## 2016-04-26 LAB — METABOLIC PANEL, COMPREHENSIVE
A-G Ratio: 1 (ref 0.8–1.7)
ALT (SGPT): 29 U/L (ref 13–56)
AST (SGOT): 23 U/L (ref 15–37)
Albumin: 3.8 g/dL (ref 3.4–5.0)
Alk. phosphatase: 106 U/L (ref 45–117)
Anion gap: 9 mmol/L (ref 3.0–18)
BUN/Creatinine ratio: 21 — ABNORMAL HIGH (ref 12–20)
BUN: 14 MG/DL (ref 7.0–18)
Bilirubin, total: 0.2 MG/DL (ref 0.2–1.0)
CO2: 25 mmol/L (ref 21–32)
Calcium: 9 MG/DL (ref 8.5–10.1)
Chloride: 105 mmol/L (ref 100–108)
Creatinine: 0.68 MG/DL (ref 0.6–1.3)
GFR est AA: >60 mL/min/{1.73_m2} (ref >60)
GFR est non-AA: >60 mL/min/{1.73_m2} (ref >60)
Globulin: 3.7 g/dL (ref 2.0–4.0)
Glucose: 108 mg/dL — ABNORMAL HIGH (ref 74–99)
Potassium: 4.1 mmol/L (ref 3.5–5.5)
Protein, total: 7.5 g/dL (ref 6.4–8.2)
Sodium: 139 mmol/L (ref 136–145)

## 2016-04-26 LAB — CBC WITH AUTOMATED DIFF
ABS. BASOPHILS: 0 10*3/uL (ref 0.0–0.06)
ABS. EOSINOPHILS: 0.2 10*3/uL (ref 0.0–0.4)
ABS. LYMPHOCYTES: 4.7 10*3/uL — ABNORMAL HIGH (ref 0.9–3.6)
ABS. MONOCYTES: 1.5 10*3/uL — ABNORMAL HIGH (ref 0.05–1.2)
ABS. NEUTROPHILS: 9.9 10*3/uL — ABNORMAL HIGH (ref 1.8–8.0)
BASOPHILS: 0 % (ref 0–2)
EOSINOPHILS: 1 % (ref 0–5)
HCT: 41.3 % (ref 35.0–45.0)
HGB: 14 g/dL (ref 12.0–16.0)
LYMPHOCYTES: 29 % (ref 21–52)
MCH: 27.8 PG (ref 24.0–34.0)
MCHC: 33.9 g/dL (ref 31.0–37.0)
MCV: 82.1 FL (ref 74.0–97.0)
MONOCYTES: 9 % (ref 3–10)
MPV: 9.5 FL (ref 9.2–11.8)
NEUTROPHILS: 61 % (ref 40–73)
PLATELET: 333 10*3/uL (ref 135–420)
RBC: 5.03 M/uL (ref 4.20–5.30)
RDW: 15.5 % — ABNORMAL HIGH (ref 11.6–14.5)
WBC: 16.3 10*3/uL — ABNORMAL HIGH (ref 4.6–13.2)

## 2016-04-26 LAB — CARDIAC PANEL,(CK, CKMB & TROPONIN)
CK - MB: <1 ng/ml (ref <3.6)
CK: 95 U/L (ref 26–192)
Troponin-I, QT: <0.02 NG/ML (ref 0.0–0.045)

## 2016-04-26 LAB — LIPASE: Lipase: 63 U/L — ABNORMAL LOW (ref 73–393)

## 2016-04-26 LAB — EKG 12-LEAD
Atrial Rate: 101 {beats}/min
P Axis: 67 degrees
P-R Interval: 152 ms
Q-T Interval: 356 ms
QRS Duration: 86 ms
QTc Calculation (Bazett): 461 ms
R Axis: 22 degrees
T Axis: 32 degrees
Ventricular Rate: 101 {beats}/min

## 2016-04-26 MED ORDER — SODIUM CHLORIDE 0.9% BOLUS IV
0.9 % | Freq: Once | INTRAVENOUS | Status: DC
Start: 2016-04-26 — End: 2016-04-25

## 2016-04-26 MED FILL — SODIUM CHLORIDE 0.9 % IV: INTRAVENOUS | Qty: 1000

## 2016-05-18 ENCOUNTER — Inpatient Hospital Stay
Admit: 2016-05-18 | Discharge: 2016-05-18 | Disposition: A | Discharge status: Home / Self Care | Payer: MEDICARE | Attending: Emergency Medicine

## 2016-05-18 DIAGNOSIS — M62838 Other muscle spasm: Secondary | ICD-10-CM

## 2016-05-18 MED ORDER — CYCLOBENZAPRINE 5 MG TAB
5 mg | ORAL_TABLET | Freq: Three times a day (TID) | ORAL | 0 refills | Status: DC
Start: 2016-05-18 — End: 2016-06-26

## 2016-05-18 MED ORDER — CYCLOBENZAPRINE 10 MG TAB
10 mg | ORAL | Status: AC
Start: 2016-05-18 — End: 2016-05-18
  Administered 2016-05-18: 19:00:00 via ORAL

## 2016-05-18 MED FILL — CYCLOBENZAPRINE 10 MG TAB: 10 mg | ORAL | Qty: 1

## 2016-05-18 NOTE — ED Provider Notes (Signed)
HPI Comments: 2:28 PM Sherri Elliott is a 40 y.o. female with h/o chronic back pain who presents to ED complaining of neck stiffness that started yesterday after noon. The reports the sx started while she was driving on the left side of her neck. The pt states the pain radiates from her neck to her head.  The pt reports the pain is exacerbated with movement.  The pt states she treated her pain with Tylenol, with no relief.  The pt denies recent trauma and back pain. The pt had no other complaints or concerns.     PCP: Jenene Slicker, MD      The history is provided by the patient. No language interpreter was used.        Past Medical History:   Diagnosis Date   ??? Asthma    ??? Musculoskeletal disorder     Chronic back pain       Past Surgical History:   Procedure Laterality Date   ??? HX BUNIONECTOMY     ??? HX COLPOSCOPY     ??? HX GI      hemorroidectomy   ??? HX HEMORRHOIDECTOMY     ??? HX ORTHOPAEDIC      bunionectomy         History reviewed. No pertinent family history.    Social History     Social History   ??? Marital status: SINGLE     Spouse name: N/A   ??? Number of children: N/A   ??? Years of education: N/A     Occupational History   ??? Not on file.     Social History Main Topics   ??? Smoking status: Current Every Day Smoker     Packs/day: 0.25   ??? Smokeless tobacco: Never Used   ??? Alcohol use No   ??? Drug use: No   ??? Sexual activity: Not Currently     Other Topics Concern   ??? Not on file     Social History Narrative         ALLERGIES: Latex; Aspirin; Banana; Ciprofloxacin; Codeine; Motrin [ibuprofen]; Toradol [ketorolac tromethamine]; Tramadol; Cat dander; Tape [adhesive]; and Venom-honey bee    Review of Systems   Constitutional: Negative for chills, fatigue and fever.   HENT: Negative.  Negative for sore throat.    Eyes: Negative.    Respiratory: Negative for cough and shortness of breath.    Cardiovascular: Negative for chest pain and palpitations.    Gastrointestinal: Negative for abdominal pain, nausea and vomiting.   Genitourinary: Negative for dysuria.   Musculoskeletal: Positive for neck pain and neck stiffness. Negative for back pain.   Skin: Negative.    Neurological: Negative for dizziness, weakness, light-headedness and headaches.   Psychiatric/Behavioral: Negative.    All other systems reviewed and are negative.      Vitals:    05/18/16 1422   BP: (!) 145/91   Pulse: 83   Resp: 16   Temp: 97.8 ??F (36.6 ??C)   SpO2: 100%            Physical Exam   Constitutional: She is oriented to person, place, and time. She appears well-developed and well-nourished. No distress.   HENT:   Head: Normocephalic and atraumatic.   Eyes: Conjunctivae are normal.   Neck: Trachea normal. Neck supple. Muscular tenderness (L trapezius, spasm) present. No spinous process tenderness present. No rigidity.   No meningeal signs.   Cardiovascular: Normal rate, regular rhythm, normal heart sounds and intact  distal pulses.    Pulmonary/Chest: Effort normal and breath sounds normal. No respiratory distress. She has no wheezes. She has no rales.   Abdominal: Soft. Bowel sounds are normal. She exhibits no distension. There is no tenderness. There is no guarding.   Musculoskeletal: She exhibits tenderness. She exhibits no edema or deformity.   Neurological: She is alert and oriented to person, place, and time.   Skin: Skin is warm and dry. No erythema.   Psychiatric: She has a normal mood and affect.        MDM  Number of Diagnoses or Management Options  Cervical paraspinal muscle spasm:     ED Course       Procedures    Vitals:  Patient Vitals for the past 24 hrs:   Temp Pulse Resp BP SpO2   05/18/16 1422 97.8 ??F (36.6 ??C) 83 16 (!) 145/91 100 %         Medications Ordered:  Medications   cyclobenzaprine (FLEXERIL) tablet 10 mg (10 mg Oral Given 05/18/16 1448)       Lab Findings:  No results found for this or any previous visit (from the past 12 hour(s)).      Reevaluation of the patient:    Pt with atraumatic neck pain with history and physical indicating cervical muscle spasm. No bony tenderness, h/o trauma, signs of meningitis, epidural hematoma/abscess, or dissection. Discussed supportive care with muscle relaxant and anti inflammatories if tolerated. Pt is comfortable with plan for discharge and counseled on return precautions.    Diagnosis:   1. Cervical paraspinal muscle spasm        Disposition: Discharge    Follow-up Information     Follow up With Details Comments Contact Info    Jenene SlickerPravin M Deshmukh, MD In 1 week  [address]  [address]  [place] [id number]  [phone]             Discharge Medication List as of 05/18/2016  2:45 PM      CONTINUE these medications which have CHANGED    Details   cyclobenzaprine (FLEXERIL) 5 mg tablet Take 2 Tabs by mouth three (3) times daily., Print, Disp-18 Tab, R-0         CONTINUE these medications which have NOT CHANGED    Details   CARVEDILOL PO Take  by mouth., Historical Med      budesonide (PULMICORT) 180 mcg/actuation aepb inhaler Take 2 Puffs by inhalation., Historical Med      albuterol (PROVENTIL HFA, VENTOLIN HFA) 90 mcg/actuation inhaler Take 2 Puffs by inhalation every four (4) hours as needed.  Historical Med, 2 Puff             Scribe Attestation      Alfonse AlpersSteven M Moore acting as a scribe for and in the presence of Landis MartinsHima K Odyssey Vasbinder, MD      May 18, 2016 at 2:28 PM       Provider Attestation:      I personally performed the services described in the documentation, reviewed the documentation, as recorded by the scribe in my presence, and it accurately and completely records my words and actions. May 18, 2016 at 2:28 PM - Landis MartinsHima K Jakki Doughty, MD

## 2016-05-18 NOTE — ED Triage Notes (Signed)
"  I have a crook in my neck that has been going on since yesterday."

## 2016-05-18 NOTE — ED Notes (Signed)
I have reviewed discharge instructions with the patient.  The patient verbalized understanding.  Patient armband removed and given to patient to take home.  Patient was informed of the privacy risks if armband lost or stolen

## 2016-06-12 NOTE — Progress Notes (Signed)
Pt is requesting PCP change. Pt would like to see Dr. Dorann LodgeBoney. Please advise.

## 2016-06-12 NOTE — Progress Notes (Signed)
Pt called central scheduling saying she's a new patient and said she needs pain meds. Central scheduling called and stated it to me and I told her to make an appointment to be evaluated but that does not guarantee pain meds. Central scheduling went and advised the patient then called me back stating that she is a current patient of  Dr. Algis Downs and wants to see Dr. Dorann LodgeBoney. Central scheduling advised her that there is a process for her to have that transfer done so they transferred her over to me. The pt sated to me that she needs a list of pain meds filled and some other meds that Dr. Algis Downs never prescribed to her. I advised that Dr. Algis Downs never prescribed her any medication and that she was dismissed from pain management. I advised her if she want a referral to pain management and her non control substances refilled she needs to come in for an appointment. Patient hung up on me.

## 2016-06-12 NOTE — Progress Notes (Signed)
It is fine with me if she wants to switch providers to get opiod pain meds as long as the other provider is willing to accept the pt

## 2016-06-13 NOTE — Telephone Encounter (Signed)
Spoke with patient and advised the Dr. Danella Pentoneshmukh agreed to release her as a patient. However, Dr. Dorann LodgeBoney is not willing to take over care. Patient advised and she stated she will call back once she receive further information on who to go to. Patient asked if it was because of Dr. Danella Pentoneshmukh and nurse explained that patient was released and this was Dr. Dorann LodgeBoney decision. Notified patient that provider will be removed from her list. This encounter will be closed.

## 2016-06-26 ENCOUNTER — Emergency Department: Admit: 2016-06-27 | Payer: MEDICARE

## 2016-06-26 DIAGNOSIS — S8002XA Contusion of left knee, initial encounter: Secondary | ICD-10-CM

## 2016-06-26 NOTE — ED Triage Notes (Addendum)
Pt fell 3 days ago by rolling off bed and landed on tile floor, landed on left side of body and injured L hip and L knee. Painful to bear weight. C/o lower back pain since the fall. Lower back pain radiates to bilateral legs, tingling to bilateral legs, denies loss of b/b control

## 2016-06-26 NOTE — ED Notes (Signed)
Verbal shift change report received from MarionNicole, LPN     Report included the following information SBAR, ED Summary    Opportunity for questions and clarification was provided.    Assumed care of patient.

## 2016-06-26 NOTE — ED Notes (Signed)
Report given to vanessa rn

## 2016-06-26 NOTE — ED Notes (Signed)
Discharge instructions reviewed with patient by MD and this RN. Prescription for Robaxin was also reviewed with patient. Patient verbalized understanding. Opportunity for questions and clarifications  was provided. Patient discharged to home. Patient driving self home

## 2016-06-26 NOTE — ED Provider Notes (Signed)
[facility] Care  Emergency Department Treatment Report        Patient: Sherri Elliott Age: 40 y.o. Sex: female    Date of Birth: 04/07/1976 Admit Date: 06/26/2016 PCP: None   MRN: [id number]  CSN: [id number]  Attending:  Gwenyth Allegra, M.D.   Room: ER04/ER04 Time Dictated: 9:40 PM APP:  None       Chief Complaint   Fall, low back pain, left hip pain, bilateral knee pain    History of Present Illness   This is a 40 y.o. female who states that she fell out of bed Saturday evening. She had a dream that she was falling and woke up when she hit the floor. She landed on her left side facing forward. Both knees hit the ground but left took the brunt of the injury. She landed on her left hip. She's been complaining of low back pain that wraps around both sides. She denies any head injury or neck pain. She denies chest pain or shortness of breath. The patient specifically denies physical abuse or assault. She denies any bowel or bladder dysfunction. She has tried taking Tylenol and using a hot water bottle and resting.    Review of Systems   Review of Systems   Constitutional: Negative for fever.   HENT: Negative for congestion, ear discharge, ear pain, nosebleeds and sore throat.    Eyes: Negative for pain, discharge and redness.   Respiratory: Negative for hemoptysis, sputum production, shortness of breath and stridor.    Cardiovascular: Negative for chest pain.   Gastrointestinal: Negative for abdominal pain, diarrhea, nausea and vomiting.   Genitourinary: Positive for frequency. Negative for dysuria and urgency.   Musculoskeletal: Positive for back pain, falls and joint pain. Negative for neck pain.   Skin: Negative for itching and rash.   Neurological: Negative for sensory change, speech change, focal weakness, loss of consciousness and headaches.   Endo/Heme/Allergies: Negative for polydipsia. Does not bruise/bleed easily.    Psychiatric/Behavioral: Negative for memory loss and substance abuse. The patient is not nervous/anxious.        Past Medical/Surgical History     Past Medical History:   Diagnosis Date   ??? Asthma    ??? Colitis    ??? Colon cancer (HCC)    ??? Musculoskeletal disorder     Chronic back pain     Past Surgical History:   Procedure Laterality Date   ??? HX BUNIONECTOMY     ??? HX COLECTOMY      Partial colon resection for colitis and colon cancer.   ??? HX COLPOSCOPY     ??? HX GI      hemorroidectomy   ??? HX HEMORRHOIDECTOMY     ??? HX LUMBAR FUSION      Anterior approach with fusion of L4-L5   ??? HX ORTHOPAEDIC Bilateral     bunionectomy     Social History     Social History     Social History   ??? Marital status: SINGLE     Spouse name: N/A   ??? Number of children: N/A   ??? Years of education: N/A     Occupational History   ??? Not on file.     Social History Main Topics   ??? Smoking status: Current Every Day Smoker     Packs/day: 0.25   ??? Smokeless tobacco: Never Used   ??? Alcohol use No   ??? Drug use: No   ??? Sexual activity: Not  Currently     Other Topics Concern   ??? Not on file     Social History Narrative     Family History     Family History   Problem Relation Age of Onset   ??? Hypertension Mother    ??? Hypertension Maternal Aunt    ??? Hypertension Maternal Grandmother    ??? Cancer Maternal Grandmother      Lungs   ??? Hypertension Maternal Grandfather      Current Medications     Prior to Admission Medications   Prescriptions Last Dose Informant Patient Reported? Taking?   albuterol (PROVENTIL HFA, VENTOLIN HFA) 90 mcg/actuation inhaler   Yes Yes   Sig: Take 2 Puffs by inhalation every four (4) hours as needed.        Facility-Administered Medications: None        Allergies     Allergies   Allergen Reactions   ??? Latex Hives   ??? Aspirin Hives   ??? Banana Hives   ??? Ciprofloxacin Hives   ??? Codeine Hives   ??? Motrin [Ibuprofen] Hives   ??? Toradol [Ketorolac Tromethamine] Hives   ??? Tramadol Hives   ??? Cat Dander Hives    ??? Contrast Agent [Iodine] Hives   ??? Tape [Adhesive] Hives   ??? Venom-Honey Bee Hives       Physical Exam     Patient Vitals for the past 24 hrs:   Temp Pulse Resp BP SpO2   06/26/16 2316 - 78 18 130/88 100 %   06/26/16 2052 98 ??F (36.7 ??C) 86 16 145/85 98 %     Physical Exam   Constitutional: She is oriented to person, place, and time. She appears not malnourished, not dehydrated and not jaundiced.  Non-toxic appearance. She does not have a sickly appearance.   HENT:   Head: Normocephalic and atraumatic.   Right Ear: External ear normal.   Left Ear: External ear normal.   Nose: Nose normal.   Mouth/Throat: Oropharynx is clear and moist.   Eyes: Conjunctivae and EOM are normal. Right eye exhibits no discharge. Left eye exhibits no discharge. No scleral icterus.   Neck: Normal range of motion. Neck supple. No tracheal deviation present.   Cardiovascular: Normal rate, regular rhythm and normal heart sounds.    Pulmonary/Chest: Effort normal and breath sounds normal. No stridor. No respiratory distress. She has no wheezes. She exhibits no tenderness.   Abdominal: Soft. Bowel sounds are normal. She exhibits no distension. There is no tenderness. There is no guarding.   Musculoskeletal: She exhibits no deformity.        Left knee: She exhibits decreased range of motion. She exhibits no swelling, no ecchymosis, no deformity, no laceration, no erythema, no LCL laxity and normal patellar mobility. Tenderness found. No medial joint line, no lateral joint line and no patellar tendon tenderness noted.        Back:         Legs:  Minimal tenderness of right knee. No clinical effusion. The knee is stable to anterior and posterior drawer testing. No tenderness in the medial lateral joint line. No tenderness over the anterior patella. No crepitus.   Lymphadenopathy:     She has no cervical adenopathy.   Neurological: She is alert and oriented to person, place, and time. GCS score is 15.    Skin: Skin is warm and dry. No rash noted. She is not diaphoretic. No erythema.   Psychiatric: Mood, memory, affect and judgment normal.  Impression and Management Plan   Patient has intact perineal sensation and no bowel or bladder dysfunction. The pain in the low back and hip does not radiate down the leg. I don't think this is cauda equina. I doubt that there is a midline spine fracture. It is very low and more consistent with the region of the sacrum and pelvis. I have ordered pelvic x-ray as well as x-ray of the left knee. Patient denies any possibility of being pregnant.    Diagnostic Studies   Lab:   Recent Results (from the past 12 hour(s))   POC URINE MACROSCOPIC    Collection Time: 06/26/16  9:53 PM   Result Value Ref Range    Glucose Negative NEGATIVE,Negative mg/dl    Bilirubin Negative NEGATIVE,Negative      Ketone Negative NEGATIVE,Negative mg/dl    Specific gravity 1.6101.015 1.005 - 1.030      Blood Negative NEGATIVE,Negative      pH (UA) 7.5 5 - 9      Protein Negative NEGATIVE,Negative mg/dl    Urobilinogen 0.2 0.0 - 1.0 EU/dl    Nitrites Negative NEGATIVE,Negative      Leukocyte Esterase Negative NEGATIVE,Negative      Color Yellow      Appearance Clear       Labs Reviewed   POC URINE MACROSCOPIC   POC URINE DIPSTICK       Imaging:    Xr Knee Lt Min 4 V    Result Date: 06/26/2016  STUDY: 4 views of the left knee INDICATION: Status post fall. Pain. COMPARISON: None.     Impression: No acute fracture or dislocation. No suprapatellar effusion. Joint spaces are grossly preserved. No acute abnormalities.     Ct Pelv Wo Cont    Result Date: 06/26/2016  Clinical history: Left pelvic lesion EXAMINATION: CT scan of the pelvis. 3 mm axial scanning is performed from the iliac crest to the symphysis pubis. Coronal and sagittal reconstruction imaging has been obtained. Correlation: Pelvic radiographs 06/26/2016 FINDINGS: There are degenerative changes of the sacroiliac joints. Density in the pelvis on  pelvic radiograph corresponds to contrast in the bladder. There is increased density in the right ureter suggestive of excreted intravenous contrast. Uterus and adnexa are unremarkable. Visualized portions of the appendix, terminal ileum and small bowel loops are within normal limits. No dominant lymph node enlargement or free fluid.     IMPRESSION: Curvilinear density on the pelvic radiograph corresponds to contrast in the bladder and right ureter. Findings discussed with Gwenyth AllegraMOTHY S Ruthanne Mcneish, 06/26/2016 11:16 PM EST.     Xr Pelv 1 Or 2 V    Result Date: 06/26/2016  STUDY: Single AP view of the pelvis INDICATION: Trauma. Pain. COMPARISON: None.     Impression: An indeterminate 1.6 x 0.8 cm lunar-shaped density structure resembling bone within the inferior pelvis. No donor site is identified about this region to suggest an acute fracture. This may be better characterize with a dedicated CT pelvis without contrast.       ED Course/Medical Decision Making   22:25  I got a phone call from radiologist, Dr.Bhadori, regarding the patient's pelvis x-ray. There is an abnormal calcification in the inferior pelvis that appears to be bony although no obvious fracture site is seen. He recommends CT of the pelvis without contrast to better characterize. I have ordered this study and discussed the x-ray results with the patient. She confirms that she has not had any recent oral contrast for any studies and she  has not had any IV contrast and confirms that she is allergic to IV contrast.    11:27 PM  After I ordered the CT the pelvis and the patient returned from CT, I received a phone call from the CT tech stating that he thought it looked like contrast within the bladder and has to wear off she had been seen today. I reviewed our electronic medical record which also shows reports from [facility] and she was not seen there. When I looked into the [facility] system via care everywhere I saw that she had been at [facility] tonight and her last vital signs were at 6:42 PM. I do not see any evidence of IV contrast during that visit however she did have x-rays of her left knee. I went in to the room and asked the patient why she would allow me to x-ray her left knee when she just had x-rays done this evening at [facility]. I again asked her if she had any other studies done anyplace else that would ever involved either a cystoscopy or IV contrast. The patient denies this. She tells me that she told "the nurse" that she had been at [facility]. and assumes that I had read the nursing notes. I reviewed the nursing notes again and there is no mention that she had been anyplace else. I had specifically told her that my plan was to start off with x-rays of the left knee and pelvis and at no point did she tell me "Dr. Peggye Form already had x-rays of my knee done today".    I just got a call from the radiologist and he confirms that this is contrast within her bladder. There is also contrast in the right ureter. There is no acute bony pathology.    The patient told me today that she is allergic to IV contrast and that it gives her hives and I have already added that to her allergy list. I have also expanded my search to include [facility] via care everywhere and there are no records there.    The patient is allergic to NSAIDs, Ultram, and codeine. This is a fall out of bed from 3 days ago with no visible ecchymosis or soft tissue swelling in either the knees, hip, or buttock and I do not feel warrants narcotic analgesia tonight. I will offer her Robaxin and continued Tylenol. I will give her primary care for follow-up as well.    Procedures  Final Diagnosis       ICD-10-CM ICD-9-CM   1. Bilateral low back pain without sciatica, unspecified chronicity M54.5 724.2   2. Contusion of left knee, initial encounter S80.02XA 924.11       Disposition    Discharge to home, PCP follow-up. Robaxin and Tylenol. Continue warm compresses.      Gwenyth Allegra, M.D.  June 26, 2016    My signature above authenticates this document and my orders, the final ??  diagnosis (es), discharge prescription (s), and instructions in the Epic ??  record.  If you have any questions please contact [phone].  ??  Nursing notes have been reviewed by the physician/ advanced practice ??  Clinician.  Dragon medical dictation software was used for portions of this report. Unintended voice recognition errors may occur.

## 2016-06-27 ENCOUNTER — Inpatient Hospital Stay
Admit: 2016-06-27 | Discharge: 2016-06-27 | Disposition: A | Discharge status: Home / Self Care | Payer: MEDICARE | Attending: Emergency Medicine

## 2016-06-27 LAB — POC URINE MACROSCOPIC
Bilirubin: NEGATIVE
Blood: NEGATIVE
Glucose: NEGATIVE mg/dl
Ketone: NEGATIVE mg/dl
Leukocyte Esterase: NEGATIVE
Nitrites: NEGATIVE
Protein: NEGATIVE mg/dl
Specific gravity: 1.015 (ref 1.005–1.030)
Urobilinogen: 0.2 EU/dl (ref 0.0–1.0)
pH (UA): 7.5 (ref 5–9)

## 2016-06-27 MED ORDER — METHOCARBAMOL 750 MG TAB
750 mg | ORAL_TABLET | Freq: Four times a day (QID) | ORAL | 0 refills | Status: AC
Start: 2016-06-27 — End: ?

## 2016-10-20 ENCOUNTER — Encounter (HOSPITAL_COMMUNITY): Payer: Self-pay | Admitting: Emergency Medicine

## 2016-10-20 ENCOUNTER — Emergency Department (HOSPITAL_COMMUNITY)
Admission: EM | Admit: 2016-10-20 | Discharge: 2016-10-20 | Disposition: A | Discharge status: Home / Self Care | Payer: Non-veteran care | Attending: Emergency Medicine | Admitting: Emergency Medicine

## 2016-10-20 ENCOUNTER — Emergency Department (HOSPITAL_COMMUNITY): Payer: Non-veteran care

## 2016-10-20 DIAGNOSIS — W06XXXA Fall from bed, initial encounter: Secondary | ICD-10-CM | POA: Diagnosis not present

## 2016-10-20 DIAGNOSIS — Y939 Activity, unspecified: Secondary | ICD-10-CM | POA: Insufficient documentation

## 2016-10-20 DIAGNOSIS — R079 Chest pain, unspecified: Secondary | ICD-10-CM | POA: Insufficient documentation

## 2016-10-20 DIAGNOSIS — F17200 Nicotine dependence, unspecified, uncomplicated: Secondary | ICD-10-CM | POA: Diagnosis not present

## 2016-10-20 DIAGNOSIS — Z859 Personal history of malignant neoplasm, unspecified: Secondary | ICD-10-CM | POA: Diagnosis not present

## 2016-10-20 DIAGNOSIS — Y929 Unspecified place or not applicable: Secondary | ICD-10-CM | POA: Diagnosis not present

## 2016-10-20 DIAGNOSIS — R030 Elevated blood-pressure reading, without diagnosis of hypertension: Secondary | ICD-10-CM | POA: Insufficient documentation

## 2016-10-20 DIAGNOSIS — M79672 Pain in left foot: Secondary | ICD-10-CM | POA: Insufficient documentation

## 2016-10-20 DIAGNOSIS — Y999 Unspecified external cause status: Secondary | ICD-10-CM | POA: Diagnosis not present

## 2016-10-20 HISTORY — DX: Fibromyalgia: M79.7

## 2016-10-20 HISTORY — DX: Polyneuropathy, unspecified: G62.9

## 2016-10-20 HISTORY — DX: Malignant (primary) neoplasm, unspecified: C80.1

## 2016-10-20 HISTORY — DX: Anxiety disorder, unspecified: F41.9

## 2016-10-20 LAB — I-STAT TROPONIN, ED: TROPONIN I, POC: 0 ng/mL (ref 0.00–0.08)

## 2016-10-20 MED ORDER — FENTANYL CITRATE (PF) 100 MCG/2ML IJ SOLN
50.0000 ug | Freq: Once | INTRAMUSCULAR | Status: AC
  Administered 2016-10-20: 50 ug via INTRAVENOUS
  Filled 2016-10-20: qty 2

## 2016-10-20 MED ORDER — CARVEDILOL 6.25 MG PO TABS: 6.2500 mg | ORAL_TABLET | Freq: Two times a day (BID) | ORAL | 0 refills | Status: DC

## 2016-10-20 NOTE — ED Provider Notes (Signed)
[facility] DEPT Provider Note   CSN: [id number] Arrival date & time: 10/20/16  1212   By signing my name below, I, Hilbert Odor, attest that this documentation has been prepared under the direction and in the presence of [facility], PA-C. Electronically Signed: Hilbert Odor, Scribe. 10/20/16. 2:04 PM. History   Chief Complaint Chief Complaint  Patient presents with  . Fall  . Foot Pain    The history is provided by the patient. No language interpreter was used.  HPI Comments: Tanya Diaz is a 41 y.o. female who presents to the Emergency Department complaining of left foot pain s/p fall that occurred around 3 to 4 hours ago. She states that she fell out of her bed this morning onto her foot. She reports pain with movement. The patient also reports having intermittent chest pain for the past 2 days. She believes that this could be due to her anxiety attacks. She describes the pain as an "achy" sensation. She reports having SOB with her episode of chest pain. She states that during each episode of chest pain, she has palpitations and diaphoresis. She states that her episodes of CP occur almost every hour for the last two days. She has had similar episodes for several months. She denies fever/chills. She states that she has not taken any of her daily medications recently due to running out a few days ago, including her coreg. No medications taken prior to her arrival for symptoms.   Past Medical History:  Diagnosis Date  . Anxiety   . Cancer ([place])   . Fibromyalgia   . Neuropathy ([place])     There are no active problems to display for this patient.   History reviewed. No pertinent surgical history.  OB History    No data available       Home Medications    Prior to Admission medications   Not on File    Family History No family history on file.  Social History Social History  Substance Use Topics  . Smoking status: Current Every Day Smoker  .  Smokeless tobacco: Current User  . Alcohol use Yes     Allergies   Patient has no allergy information on record.   Review of Systems Review of Systems  Constitutional: Positive for diaphoresis. Negative for fever.  Respiratory: Positive for shortness of breath. Negative for cough.   Cardiovascular: Positive for chest pain.  Musculoskeletal: Positive for arthralgias (Left Ankle).  All other systems reviewed and are negative.    Physical Exam Updated Vital Signs BP (!) 123/101 (BP Location: Right Arm)   Pulse 78   Temp 98.3 F (36.8 C) (Oral)   Resp 17   Ht 5\' 3"  (1.6 m)   Wt 160 lb (72.6 kg)   LMP 10/17/2016   SpO2 100%   BMI 28.34 kg/m   Physical Exam  Constitutional: She is oriented to person, place, and time. She appears well-developed and well-nourished. No distress.  HENT:  Head: Normocephalic and atraumatic.  Neck: No JVD present.  Cardiovascular: Normal rate, regular rhythm and normal heart sounds.   No murmur heard. Pulmonary/Chest: Effort normal and breath sounds normal. No respiratory distress. She has no wheezes. She has no rales.  Abdominal: Soft. She exhibits no distension. There is no tenderness.  Musculoskeletal: She exhibits no edema.  TTP along first MTP. No erythema, swelling or warmth appreciated. Full ROM. 2+ DP. Sensation intact.   Neurological: She is alert and oriented to person, place, and time.  Skin: Skin is warm and dry.  Nursing note and vitals reviewed.   ED Treatments / Results  DIAGNOSTIC STUDIES: Oxygen Saturation is 100% on RA, normal by my interpretation.    COORDINATION OF CARE: 1:41 PM Discussed treatment plan with pt at bedside and pt agreed to plan. I will check the patient's EKG, CXR, and X-ray of her left ankle.  Labs (all labs ordered are listed, but only abnormal results are displayed) Labs Reviewed  CBC WITH DIFFERENTIAL/PLATELET  BASIC METABOLIC PANEL  I-STAT [place], ED    EKG  EKG Interpretation None        Radiology No results found.  Procedures Procedures (including critical care time)  Medications Ordered in ED Medications  fentaNYL (SUBLIMAZE) injection 50 mcg (not administered)     Initial Impression / Assessment and Plan / ED Course  I have reviewed the triage vital signs and the nursing notes.  Pertinent labs & imaging results that were available during my care of the patient were reviewed by me and considered in my medical decision making (see chart for details).    Tanya Diaz is a 41 y.o. female who presents to ED for two complaints:  1. Left foot pain. Exam with no signs to suggest infection. X-ray negative for acute injury. Symptomatic home care instructions discussed including ice and NSAIDs. Postop shoe given for comfort. PCP follow-up recommended.  2. Chest pain x 2-3 days. EKG NSR with no ischemia. Troponin negative. CXR negative. Benign cardiopulmonary exam. PCP follow up strongly encouraged. Chest pain free throughout ED stay. ACS unlikely.   Patient recently moved to the area and requesting refills of her medication. Coreg 6.25 BID given. She also is requesting refills of her pain medication. Discussed with patient that we do not refill chronic pain medication in the emergency department. PCP follow up strongly encouraged. Reasons to return to ER discussed. All questions answered.    Final Clinical Impressions(s) / ED Diagnoses   Final diagnoses:  Chest pain    New Prescriptions New Prescriptions   No medications on file   I personally performed the services described in this documentation, which was scribed in my presence. The recorded information has been reviewed and is accurate.    [facility] Jayleen Scaglione, PA-C 10/20/16 1614    Isla Pence, MD 10/20/16 [phone]

## 2016-10-20 NOTE — Discharge Instructions (Signed)
Take your medications daily as directed.  Follow up with your primary care provider for recheck of symptoms.  Return to ER for new or worsening symptoms, any additional concerns.

## 2016-10-20 NOTE — ED Triage Notes (Signed)
Pt. Stated, I fell out of bed and hurt my rt. Foot.

## 2016-10-20 NOTE — ED Notes (Signed)
47mcg fentanyl wasted in sink with Nonie Hoyer, RN

## 2017-02-22 ENCOUNTER — Emergency Department (HOSPITAL_COMMUNITY)
Admission: EM | Admit: 2017-02-22 | Discharge: 2017-02-22 | Disposition: A | Discharge status: Home / Self Care | Payer: Medicare Other | Attending: Emergency Medicine | Admitting: Emergency Medicine

## 2017-02-22 ENCOUNTER — Emergency Department (HOSPITAL_COMMUNITY): Payer: Medicare Other

## 2017-02-22 ENCOUNTER — Encounter (HOSPITAL_COMMUNITY): Payer: Self-pay | Admitting: Emergency Medicine

## 2017-02-22 DIAGNOSIS — F17210 Nicotine dependence, cigarettes, uncomplicated: Secondary | ICD-10-CM | POA: Diagnosis not present

## 2017-02-22 DIAGNOSIS — N921 Excessive and frequent menstruation with irregular cycle: Secondary | ICD-10-CM | POA: Insufficient documentation

## 2017-02-22 DIAGNOSIS — Y939 Activity, unspecified: Secondary | ICD-10-CM | POA: Insufficient documentation

## 2017-02-22 DIAGNOSIS — Z91040 Latex allergy status: Secondary | ICD-10-CM | POA: Diagnosis not present

## 2017-02-22 DIAGNOSIS — Z79899 Other long term (current) drug therapy: Secondary | ICD-10-CM | POA: Insufficient documentation

## 2017-02-22 DIAGNOSIS — Y999 Unspecified external cause status: Secondary | ICD-10-CM | POA: Diagnosis not present

## 2017-02-22 DIAGNOSIS — Y929 Unspecified place or not applicable: Secondary | ICD-10-CM | POA: Insufficient documentation

## 2017-02-22 DIAGNOSIS — S39012A Strain of muscle, fascia and tendon of lower back, initial encounter: Secondary | ICD-10-CM

## 2017-02-22 DIAGNOSIS — Z85038 Personal history of other malignant neoplasm of large intestine: Secondary | ICD-10-CM | POA: Insufficient documentation

## 2017-02-22 DIAGNOSIS — X58XXXA Exposure to other specified factors, initial encounter: Secondary | ICD-10-CM | POA: Insufficient documentation

## 2017-02-22 DIAGNOSIS — R52 Pain, unspecified: Secondary | ICD-10-CM

## 2017-02-22 DIAGNOSIS — M545 Low back pain: Secondary | ICD-10-CM | POA: Diagnosis present

## 2017-02-22 HISTORY — DX: Polycythemia vera: D45

## 2017-02-22 LAB — POC URINE PREG, ED: Preg Test, Ur: NEGATIVE

## 2017-02-22 LAB — PREGNANCY, URINE: Preg Test, Ur: NEGATIVE

## 2017-02-22 MED ORDER — LIDOCAINE 5 % EX PTCH
1.0000 | MEDICATED_PATCH | CUTANEOUS | Status: DC
  Administered 2017-02-22: 1 via TRANSDERMAL
  Filled 2017-02-22: qty 1

## 2017-02-22 MED ORDER — METHOCARBAMOL 500 MG PO TABS
1000.0000 mg | ORAL_TABLET | Freq: Once | ORAL | Status: AC
  Administered 2017-02-22: 1000 mg via ORAL
  Filled 2017-02-22: qty 2

## 2017-02-22 MED ORDER — METHOCARBAMOL 500 MG PO TABS: 1000.0000 mg | ORAL_TABLET | Freq: Four times a day (QID) | ORAL | 0 refills | Status: DC | PRN

## 2017-02-22 NOTE — ED Provider Notes (Signed)
[facility] DEPT Provider Note   CSN: [id number] Arrival date & time: 02/22/17  0549     History   Chief Complaint Chief Complaint  Patient presents with  . Back Pain    HPI   Blood pressure (!) 146/111, pulse 85, temperature 98.3 F (36.8 C), temperature source Oral, resp. rate (!) 22, height 5\' 3"  (1.6 m), weight 81.6 kg (180 lb), last menstrual period 02/19/2017, SpO2 100 %.  KOURTLYN CHARLET is a 41 y.o. female complaining ofLow back pain over the last 2 days. Nonradiating. She denies any incontinence, , trauma, fever, chills. She has a history of colon cancer which has been in remission for 6 years. She's been noncompliant with her follow-up.  She also notes a history of polycythemia vera. She does not have a local oncologist. She states that she typically doesn't have back pain. She denies any numbness, weakness, difficulty ambulating. She's been taking gabapentin with little relief. States that the pain is exacerbated by movement and certain positions. She states there is no possible way that she could be pregnant. She notes that she has a regular period that only comes on once a year but it she's been having her period every other month. She is requesting a new primary care physician.   Past Medical History:  Diagnosis Date  . Anxiety   . Cancer [facility])    Colon  . Fibromyalgia   . Neuropathy   . Polycythemia vera ([place])     There are no active problems to display for this patient.   Past Surgical History:  Procedure Laterality Date  . COLON SURGERY      OB History    No data available       Home Medications    Prior to Admission medications   Medication Sig Start Date End Date Taking? Authorizing Provider  carvedilol (COREG) 6.25 MG tablet Take 1 tablet (6.25 mg total) by mouth 2 (two) times daily with a meal. 10/20/16   Ward, Ozella Almond, PA-C  methocarbamol (ROBAXIN) 500 MG tablet Take 2 tablets (1,000 mg total) by mouth 4 (four) times daily as  needed (Pain). 02/22/17   Damonique Brunelle, Charna Elizabeth    Family History History reviewed. No pertinent family history.  Social History Social History  Substance Use Topics  . Smoking status: Current Every Day Smoker  . Smokeless tobacco: Current User  . Alcohol use Yes     Allergies   Banana; Ciprofloxacin; Latex; and Nsaids   Review of Systems Review of Systems  A complete review of systems was obtained and all systems are negative except as noted in the HPI and PMH.   Physical Exam Updated Vital Signs BP (!) 146/111 (BP Location: Left Arm)   Pulse 85   Temp 98.3 F (36.8 C) (Oral)   Resp (!) 22   Ht 5\' 3"  (1.6 m)   Wt 81.6 kg (180 lb)   LMP 02/19/2017   SpO2 100%   BMI 31.89 kg/m   Physical Exam  Constitutional: She is oriented to person, place, and time. She appears well-developed and well-nourished. No distress.  HENT:  Head: Normocephalic and atraumatic.  Mouth/Throat: Oropharynx is clear and moist.  Eyes: Pupils are equal, round, and reactive to light. Conjunctivae and EOM are normal.  Neck: Normal range of motion.  Cardiovascular: Normal rate, regular rhythm and intact distal pulses.   Pulmonary/Chest: Effort normal and breath sounds normal.  Abdominal: Soft. There is no tenderness.  Musculoskeletal: Normal range of motion.  Neurological: She is alert and oriented to person, place, and time.  No point tenderness to percussion of lumbar spinal processes.  No TTP or paraspinal muscular spasm. Strength is 5 out of 5 to bilateral lower extremities at hip and knee; extensor hallucis longus 5 out of 5. Ankle strength 5 out of 5, no clonus, neurovascularly intact. No saddle anaesthesia. Patellar reflexes are 2+ bilaterally.    Skin: She is not diaphoretic.  Psychiatric: She has a normal mood and affect.  Nursing note and vitals reviewed.    ED Treatments / Results  Labs (all labs ordered are listed, but only abnormal results are displayed) Labs Reviewed    PREGNANCY, URINE  POC URINE PREG, ED    EKG  EKG Interpretation None       Radiology Dg Thoracic Spine W/swimmers  Result Date: 02/22/2017 CLINICAL DATA:  Worsening mid to lower back pain for 3 days, atraumatic. EXAM: THORACIC SPINE - 3 VIEWS COMPARISON:  None. FINDINGS: There is no evidence of thoracic spine fracture. Alignment is normal. No other significant bone abnormalities are identified. IMPRESSION: Negative. Electronically Signed   By: Monte Fantasia M.D.   On: 02/22/2017 07:31   Dg Lumbar Spine Complete  Result Date: 02/22/2017 CLINICAL DATA:  Worsening mid to lower back pain for 3 days, nontraumatic EXAM: LUMBAR SPINE - COMPLETE 4+ VIEW COMPARISON:  None. FINDINGS: Hypoplastic twelfth ribs based on 10/20/2016 chest x-ray. There is articulation between the L5 vertebra left transverse process and sacrum. No acute fracture, endplate erosion, or significant degenerative change. IMPRESSION: Negative. Electronically Signed   By: Monte Fantasia M.D.   On: 02/22/2017 07:29    Procedures Procedures (including critical care time)  Medications Ordered in ED Medications  lidocaine (LIDODERM) 5 % 1 patch (1 patch Transdermal Patch Applied 02/22/17 0701)  methocarbamol (ROBAXIN) tablet 1,000 mg (1,000 mg Oral Given 02/22/17 0701)     Initial Impression / Assessment and Plan / ED Course  I have reviewed the triage vital signs and the nursing notes.  Pertinent labs & imaging results that were available during my care of the patient were reviewed by me and considered in my medical decision making (see chart for details).     Vitals:   02/22/17 0554 02/22/17 0555  BP: (!) 146/111   Pulse: 85   Resp: (!) 22   Temp: 98.3 F (36.8 C)   TempSrc: Oral   SpO2: 100%   Weight:  81.6 kg (180 lb)  Height:  5\' 3"  (1.6 m)    Medications  lidocaine (LIDODERM) 5 % 1 patch (1 patch Transdermal Patch Applied 02/22/17 0701)  methocarbamol (ROBAXIN) tablet 1,000 mg (1,000 mg Oral Given  02/22/17 0701)    SAMENTHA PERHAM is 41 y.o. female presenting with Atraumatic back pain onset 2 days ago. Normal neurologic exam with no red flags. Patient has history of colon cancer in his been noncompliant with her planned surveillance. Plain films to evaluate metastases/pathologic fracture. Attending extensive discussion on the importance of following up with oncology and patient verbalized understanding and teach back technique. Patient is also requesting a new primary care physician, patient given referral [facility] and [facility].  Imaging negative, patient given Robaxin and Lidoderm patches for comfort.  Evaluation does not show pathology that would require ongoing emergent intervention or inpatient treatment. Pt is hemodynamically stable and mentating appropriately. Discussed findings and plan with patient/guardian, who agrees with care plan. All questions answered. Return precautions discussed and outpatient  follow up given.     Final Clinical Impressions(s) / ED Diagnoses   Final diagnoses:  Pain  Metrorrhagia  Low back strain, initial encounter    New Prescriptions New Prescriptions   METHOCARBAMOL (ROBAXIN) 500 MG TABLET    Take 2 tablets (1,000 mg total) by mouth 4 (four) times daily as needed (Pain).     Waynetta Pean 48/01/65 [id number]    Delora Fuel, MD 82/70/78 2253

## 2017-02-22 NOTE — ED Triage Notes (Signed)
Patient complaining of back pain that started three days ago. Patient does not know what caused it. She thought it was her cycle but it is gone and the back pain is still there.

## 2017-02-22 NOTE — Discharge Instructions (Addendum)
It is critically important that you follow with oncology, Dr. Burr Medico, as soon as possible.  For pain control you may take up to 800mg  of Motrin (also known as ibuprofen). That is usually 4 over the counter pills,  3 times a day. Take with food to minimize stomach irritation   You can also take  tylenol (acetaminophen) 975mg  (this is 3 over the counter pills) four times a day. Do not drink alcohol or combine with other medications that have acetaminophen as an ingredient (Read the labels!).    For breakthrough pain you may take Robaxin. Do not drink alcohol, drive or operate heavy machinery when taking Robaxin.  Please follow with your primary care doctor in the next 2 days for a check-up. They must obtain records for further management.   Do not hesitate to return to the Emergency Department for any new, worsening or concerning symptoms.

## 2017-02-28 ENCOUNTER — Encounter (HOSPITAL_COMMUNITY): Payer: Self-pay | Admitting: Emergency Medicine

## 2017-02-28 ENCOUNTER — Emergency Department (HOSPITAL_COMMUNITY)
Admission: EM | Admit: 2017-02-28 | Discharge: 2017-02-28 | Disposition: A | Discharge status: Home / Self Care | Payer: Medicare Other | Attending: Emergency Medicine | Admitting: Emergency Medicine

## 2017-02-28 DIAGNOSIS — I10 Essential (primary) hypertension: Secondary | ICD-10-CM | POA: Diagnosis not present

## 2017-02-28 DIAGNOSIS — R51 Headache: Secondary | ICD-10-CM | POA: Insufficient documentation

## 2017-02-28 DIAGNOSIS — R112 Nausea with vomiting, unspecified: Secondary | ICD-10-CM | POA: Diagnosis not present

## 2017-02-28 DIAGNOSIS — R197 Diarrhea, unspecified: Secondary | ICD-10-CM | POA: Diagnosis not present

## 2017-02-28 DIAGNOSIS — Z79899 Other long term (current) drug therapy: Secondary | ICD-10-CM | POA: Insufficient documentation

## 2017-02-28 DIAGNOSIS — F17200 Nicotine dependence, unspecified, uncomplicated: Secondary | ICD-10-CM | POA: Diagnosis not present

## 2017-02-28 DIAGNOSIS — M5441 Lumbago with sciatica, right side: Secondary | ICD-10-CM | POA: Diagnosis not present

## 2017-02-28 DIAGNOSIS — Z91040 Latex allergy status: Secondary | ICD-10-CM | POA: Diagnosis not present

## 2017-02-28 DIAGNOSIS — G8929 Other chronic pain: Secondary | ICD-10-CM | POA: Diagnosis not present

## 2017-02-28 DIAGNOSIS — H9190 Unspecified hearing loss, unspecified ear: Secondary | ICD-10-CM | POA: Diagnosis not present

## 2017-02-28 DIAGNOSIS — R519 Headache, unspecified: Secondary | ICD-10-CM

## 2017-02-28 DIAGNOSIS — C189 Malignant neoplasm of colon, unspecified: Secondary | ICD-10-CM | POA: Insufficient documentation

## 2017-02-28 HISTORY — DX: Essential (primary) hypertension: I10

## 2017-02-28 HISTORY — DX: Diverticulosis of intestine, part unspecified, without perforation or abscess without bleeding: K57.90

## 2017-02-28 LAB — CBC WITH DIFFERENTIAL/PLATELET
BASOS ABS: 0 10*3/uL (ref 0.0–0.1)
Basophils Relative: 0 %
EOS ABS: 0.3 10*3/uL (ref 0.0–0.7)
EOS PCT: 2 %
HCT: 42.1 % (ref 36.0–46.0)
HEMOGLOBIN: 14.6 g/dL (ref 12.0–15.0)
LYMPHS PCT: 23 %
Lymphs Abs: 3.5 10*3/uL (ref 0.7–4.0)
MCH: 28.7 pg (ref 26.0–34.0)
MCHC: 34.7 g/dL (ref 30.0–36.0)
MCV: 82.9 fL (ref 78.0–100.0)
Monocytes Absolute: 1.1 10*3/uL — ABNORMAL HIGH (ref 0.1–1.0)
Monocytes Relative: 7 %
NEUTROS PCT: 68 %
Neutro Abs: 10.5 10*3/uL — ABNORMAL HIGH (ref 1.7–7.7)
PLATELETS: 387 10*3/uL (ref 150–400)
RBC: 5.08 MIL/uL (ref 3.87–5.11)
RDW: 15.1 % (ref 11.5–15.5)
WBC: 15.3 10*3/uL — AB (ref 4.0–10.5)

## 2017-02-28 LAB — I-STAT CHEM 8, ED
BUN: 16 mg/dL (ref 6–20)
CALCIUM ION: 1.1 mmol/L — AB (ref 1.15–1.40)
Chloride: 103 mmol/L (ref 101–111)
Creatinine, Ser: 0.5 mg/dL (ref 0.44–1.00)
GLUCOSE: 110 mg/dL — AB (ref 65–99)
HEMATOCRIT: 50 % — AB (ref 36.0–46.0)
Hemoglobin: 17 g/dL — ABNORMAL HIGH (ref 12.0–15.0)
Potassium: 3.7 mmol/L (ref 3.5–5.1)
Sodium: 137 mmol/L (ref 135–145)
TCO2: 26 mmol/L (ref 0–100)

## 2017-02-28 LAB — URINALYSIS, ROUTINE W REFLEX MICROSCOPIC
BILIRUBIN URINE: NEGATIVE
GLUCOSE, UA: NEGATIVE mg/dL
HGB URINE DIPSTICK: NEGATIVE
KETONES UR: NEGATIVE mg/dL
LEUKOCYTES UA: NEGATIVE
Nitrite: NEGATIVE
Protein, ur: NEGATIVE mg/dL
Specific Gravity, Urine: 1.034 — ABNORMAL HIGH (ref 1.005–1.030)
pH: 5 (ref 5.0–8.0)

## 2017-02-28 LAB — PREGNANCY, URINE: Preg Test, Ur: NEGATIVE

## 2017-02-28 MED ORDER — DEXAMETHASONE SODIUM PHOSPHATE 10 MG/ML IJ SOLN
10.0000 mg | Freq: Once | INTRAMUSCULAR | Status: DC
  Filled 2017-02-28: qty 1

## 2017-02-28 MED ORDER — DIPHENHYDRAMINE HCL 50 MG/ML IJ SOLN
INTRAMUSCULAR | Status: AC
  Administered 2017-02-28: 12.5 mg via INTRAVENOUS
  Filled 2017-02-28: qty 1

## 2017-02-28 MED ORDER — OXYCODONE-ACETAMINOPHEN 5-325 MG PO TABS: 1.0000 | ORAL_TABLET | ORAL | 0 refills | Status: DC | PRN

## 2017-02-28 MED ORDER — METOCLOPRAMIDE HCL 5 MG/ML IJ SOLN
10.0000 mg | Freq: Once | INTRAMUSCULAR | Status: DC
  Filled 2017-02-28: qty 2

## 2017-02-28 MED ORDER — ONDANSETRON HCL 4 MG PO TABS: 4.0000 mg | ORAL_TABLET | Freq: Four times a day (QID) | ORAL | 0 refills | Status: DC

## 2017-02-28 MED ORDER — METOCLOPRAMIDE HCL 5 MG/ML IJ SOLN
10.0000 mg | Freq: Once | INTRAMUSCULAR | Status: AC
  Administered 2017-02-28: 10 mg via INTRAVENOUS

## 2017-02-28 MED ORDER — DIPHENHYDRAMINE HCL 25 MG PO CAPS: 25.0000 mg | ORAL_CAPSULE | Freq: Once | ORAL | Status: DC

## 2017-02-28 MED ORDER — DEXAMETHASONE SODIUM PHOSPHATE 10 MG/ML IJ SOLN
10.0000 mg | Freq: Once | INTRAMUSCULAR | Status: AC
  Administered 2017-02-28: 10 mg via INTRAVENOUS

## 2017-02-28 MED ORDER — DIPHENHYDRAMINE HCL 50 MG/ML IJ SOLN
12.5000 mg | Freq: Once | INTRAMUSCULAR | Status: AC
  Administered 2017-02-28: 12.5 mg via INTRAVENOUS

## 2017-02-28 MED ORDER — SODIUM CHLORIDE 0.9 % IV BOLUS (SEPSIS)
1000.0000 mL | Freq: Once | INTRAVENOUS | Status: AC
  Administered 2017-02-28: 1000 mL via INTRAVENOUS

## 2017-02-28 NOTE — ED Provider Notes (Signed)
[facility] DEPT Provider Note   CSN: [id number] Arrival date & time: 02/28/17  0429     History   Chief Complaint Chief Complaint  Patient presents with  . Back Pain  . Headache  . Emesis  . Diarrhea    HPI Tanya Diaz is a 41 y.o. female.  Patient with history of HTN, polycythemia vera, colon CA s/p resection,  is here for ongoing back pain with radiation to right leg associated with numbness to lateral foot. No weakness. No urinary/bowel incontinence. Symptoms present for the past 10 days without known injury. She reports history of back problems in the past. She reports having a throbbing headache with change in hearing and nausea with vomiting x 3 days. She is also reporting diarrhea for the last 1 day. No fever.    The history is provided by the patient. No language interpreter was used.  Back Pain   Associated symptoms include numbness (Right leg) and headaches. Pertinent negatives include no fever, no abdominal pain and no weakness.  Headache   Associated symptoms include vomiting. Pertinent negatives include no fever.  Emesis   Associated symptoms include diarrhea and headaches. Pertinent negatives include no abdominal pain, no chills and no fever.  Diarrhea   Associated symptoms include vomiting and headaches. Pertinent negatives include no abdominal pain and no chills.    Past Medical History:  Diagnosis Date  . Anxiety   . Anxiety   . Cancer [facility])    Colon  . Diverticulosis   . Fibromyalgia   . Hypertension   . Neuropathy   . Polycythemia vera ([place])     There are no active problems to display for this patient.   Past Surgical History:  Procedure Laterality Date  . COLON SURGERY    . FOOT SURGERY    . HEMORROIDECTOMY    . SPINAL FUSION      OB History    No data available       Home Medications    Prior to Admission medications   Medication Sig Start Date End Date Taking? Authorizing Provider  acetaminophen (TYLENOL) 500 MG  tablet Take 500 mg by mouth every 6 (six) hours as needed for mild pain.   Yes [provider]  albuterol (PROVENTIL HFA;VENTOLIN HFA) 108 (90 Base) MCG/ACT inhaler Inhale 1 puff into the lungs as needed. 10/05/16  Yes [provider]  carvedilol (COREG) 6.25 MG tablet Take 1 tablet (6.25 mg total) by mouth 2 (two) times daily with a meal. 10/20/16  Yes Ward, Ozella Almond, PA-C  gabapentin (NEURONTIN) 300 MG capsule Take 300 mg by mouth 3 (three) times daily.   Yes [provider]  hydrOXYzine (VISTARIL) 25 MG capsule Take 25 mg by mouth 3 (three) times daily as needed for anxiety.   Yes [provider]  methocarbamol (ROBAXIN) 500 MG tablet Take 2 tablets (1,000 mg total) by mouth 4 (four) times daily as needed (Pain). Patient not taking: Reported on 02/28/2017 02/22/17   Pisciotta, Elmyra Ricks, PA-C    Family History Family History  Problem Relation Age of Onset  . Hypertension Other   . Cancer Other     Social History Social History  Substance Use Topics  . Smoking status: Current Every Day Smoker  . Smokeless tobacco: Current User  . Alcohol use No     Allergies   Banana; Ciprofloxacin; Latex; Nsaids; and Penicillins   Review of Systems Review of Systems  Constitutional: Negative for chills and fever.  HENT:  Positive for hearing loss.   Respiratory: Negative.   Cardiovascular: Negative.   Gastrointestinal: Positive for diarrhea and vomiting. Negative for abdominal pain.  Musculoskeletal: Positive for back pain.  Skin: Negative.   Neurological: Positive for numbness (Right leg) and headaches. Negative for weakness.     Physical Exam Updated Vital Signs BP (!) 158/122 (BP Location: Left Arm)   Pulse (!) 109   Temp 98.3 F (36.8 C) (Oral)   Resp 20   Ht 5\' 3"  (1.6 m)   Wt 81.6 kg (180 lb)   LMP 02/19/2017 Comment: irregular  SpO2 100%   BMI 31.89 kg/m   Physical Exam  Constitutional: She is oriented to person, place, and time. She  appears well-developed and well-nourished.  HENT:  Head: Normocephalic.  Mouth/Throat: Mucous membranes are dry.  Neck: Normal range of motion. Neck supple.  Cardiovascular: Regular rhythm.  Tachycardia present.   Pulmonary/Chest: Effort normal and breath sounds normal. She has no wheezes. She has no rales.  Abdominal: Soft. Bowel sounds are normal. There is no tenderness. There is no rebound and no guarding.  Musculoskeletal: Normal range of motion.  No tenderness of lumbar or paralumbar areas.   Neurological: She is alert and oriented to person, place, and time.  CN's 3-12 grossly intact. Speech is clear and focused. No facial asymmetry. No lateralizing weakness. Reflexes are equal. No deficits of coordination. Ambulatory without imbalance.    Skin: Skin is warm and dry. No rash noted.  Psychiatric: She has a normal mood and affect.     ED Treatments / Results  Labs (all labs ordered are listed, but only abnormal results are displayed) Labs Reviewed  URINALYSIS, ROUTINE W REFLEX MICROSCOPIC - Abnormal; Notable for the following:       Result Value   Specific Gravity, Urine 1.034 (*)    All other components within normal limits  I-STAT CHEM 8, ED - Abnormal; Notable for the following:    Glucose, Bld 110 (*)    Calcium, Ion 1.10 (*)    Hemoglobin 17.0 (*)    HCT 50.0 (*)    All other components within normal limits  URINE CULTURE  PREGNANCY, URINE    EKG  EKG Interpretation None       Radiology No results found.  Procedures Procedures (including critical care time)  Medications Ordered in ED Medications - No data to display   Initial Impression / Assessment and Plan / ED Course  I have reviewed the triage vital signs and the nursing notes.  Pertinent labs & imaging results that were available during my care of the patient were reviewed by me and considered in my medical decision making (see chart for details).     Patient presents with multiple symptoms,  including headache, back pain with radicular symptoms and without neurologic deficits, vomiting and diarrhea. No fever.   The patient has no vomiting or diarrhea in the ED. Resting comfortably on recheck. She is found to have concentrated urine suggesting dehydration. Hct also 50 on I-stat chem 8, raising concern for symptomatic polycythemia vera as contributing to headache. Will recheck with full CBC to insure accuracy.   No neurologic deficits on exam. The patient is resting comfortably. Will provide IV fluids, recheck CBC, give headache cocktail with Decadron, Benadryl, and reglan.   Patient signed out to Doreen Salvage, PA-C, for re-evaluation of hemoglobin and headache. Anticipate discharge home. She has appointment with neurosurgery next week for further evaluation of radicular back pain. She has referrals in  place with oncology and PCP for which she is making appointments.   Final Clinical Impressions(s) / ED Diagnoses   Final diagnoses:  None   1. Headache 2. Emesis 3. Diarrhea  New Prescriptions New Prescriptions   No medications on file     Charlann Lange, Hershal Coria 02/28/17 [place], Jordan, DO 02/28/17 [phone]

## 2017-02-28 NOTE — ED Notes (Signed)
Pt states that she has back pain to mid back pain that worsens with bending over. Pt states that the back pain has gave  her throbbing HA that radiates to throbbing in her ears, that started intermittent x 2-3 weeks. Pt also reports losing haring, intermittent numbness in her legs . Pt states that she has been having nausea and vomiting mostly dry heaving every hour. Pt states that she has had diarrhea all day x1 day, Pt denies blood in stool. Pt MR states that she has fibromyalgia, pt states she has never been dx.Marland Kitchen

## 2017-02-28 NOTE — ED Triage Notes (Signed)
Pt is c/o back pain, nausea, vomiting, and diarrhea  Pt is c/o headache

## 2017-02-28 NOTE — Discharge Instructions (Signed)
Keep your scheduled appointments with your community physicians for further evaluation and management of multiple symptoms. Return to the emergency department with any new concerns or worsening symptoms.

## 2017-03-01 LAB — URINE CULTURE: Culture: NO GROWTH

## 2017-05-17 ENCOUNTER — Encounter (HOSPITAL_COMMUNITY): Payer: Self-pay | Admitting: *Deleted

## 2017-05-17 ENCOUNTER — Emergency Department (HOSPITAL_COMMUNITY): Payer: Non-veteran care

## 2017-05-17 ENCOUNTER — Emergency Department (HOSPITAL_COMMUNITY)
Admission: EM | Admit: 2017-05-17 | Discharge: 2017-05-17 | Disposition: A | Discharge status: Home / Self Care | Payer: Non-veteran care | Attending: Emergency Medicine | Admitting: Emergency Medicine

## 2017-05-17 DIAGNOSIS — Z85038 Personal history of other malignant neoplasm of large intestine: Secondary | ICD-10-CM | POA: Diagnosis not present

## 2017-05-17 DIAGNOSIS — F17200 Nicotine dependence, unspecified, uncomplicated: Secondary | ICD-10-CM | POA: Diagnosis not present

## 2017-05-17 DIAGNOSIS — M5441 Lumbago with sciatica, right side: Secondary | ICD-10-CM

## 2017-05-17 DIAGNOSIS — Z79899 Other long term (current) drug therapy: Secondary | ICD-10-CM | POA: Diagnosis not present

## 2017-05-17 DIAGNOSIS — R7401 Elevation of levels of liver transaminase levels: Secondary | ICD-10-CM

## 2017-05-17 DIAGNOSIS — M545 Low back pain: Secondary | ICD-10-CM | POA: Diagnosis present

## 2017-05-17 DIAGNOSIS — R740 Nonspecific elevation of levels of transaminase and lactic acid dehydrogenase [LDH]: Secondary | ICD-10-CM | POA: Diagnosis not present

## 2017-05-17 DIAGNOSIS — M5440 Lumbago with sciatica, unspecified side: Secondary | ICD-10-CM | POA: Insufficient documentation

## 2017-05-17 DIAGNOSIS — I10 Essential (primary) hypertension: Secondary | ICD-10-CM | POA: Insufficient documentation

## 2017-05-17 DIAGNOSIS — M5442 Lumbago with sciatica, left side: Secondary | ICD-10-CM

## 2017-05-17 LAB — COMPREHENSIVE METABOLIC PANEL
ALK PHOS: 109 U/L (ref 38–126)
ALT: 245 U/L — ABNORMAL HIGH (ref 14–54)
ANION GAP: 8 (ref 5–15)
AST: 170 U/L — ABNORMAL HIGH (ref 15–41)
Albumin: 4 g/dL (ref 3.5–5.0)
BUN: 8 mg/dL (ref 6–20)
CALCIUM: 9.2 mg/dL (ref 8.9–10.3)
CO2: 22 mmol/L (ref 22–32)
Chloride: 106 mmol/L (ref 101–111)
Creatinine, Ser: 0.67 mg/dL (ref 0.44–1.00)
GFR calc non Af Amer: >60 mL/min (ref >60)
Glucose, Bld: 122 mg/dL — ABNORMAL HIGH (ref 65–99)
POTASSIUM: 4.2 mmol/L (ref 3.5–5.1)
SODIUM: 136 mmol/L (ref 135–145)
Total Bilirubin: 0.5 mg/dL (ref 0.3–1.2)
Total Protein: 7.2 g/dL (ref 6.5–8.1)

## 2017-05-17 LAB — CBC
HCT: 41.5 % (ref 36.0–46.0)
HEMOGLOBIN: 14 g/dL (ref 12.0–15.0)
MCH: 28 pg (ref 26.0–34.0)
MCHC: 33.7 g/dL (ref 30.0–36.0)
MCV: 83 fL (ref 78.0–100.0)
Platelets: 318 10*3/uL (ref 150–400)
RBC: 5 MIL/uL (ref 3.87–5.11)
RDW: 13.8 % (ref 11.5–15.5)
WBC: 11.9 10*3/uL — ABNORMAL HIGH (ref 4.0–10.5)

## 2017-05-17 LAB — URINALYSIS, ROUTINE W REFLEX MICROSCOPIC
Bilirubin Urine: NEGATIVE
Glucose, UA: NEGATIVE mg/dL
HGB URINE DIPSTICK: NEGATIVE
Ketones, ur: NEGATIVE mg/dL
Leukocytes, UA: NEGATIVE
NITRITE: NEGATIVE
Protein, ur: NEGATIVE mg/dL
SPECIFIC GRAVITY, URINE: 1.015 (ref 1.005–1.030)
pH: 6 (ref 5.0–8.0)

## 2017-05-17 LAB — POC URINE PREG, ED: PREG TEST UR: NEGATIVE

## 2017-05-17 MED ORDER — PREDNISONE 20 MG PO TABS: 40.0000 mg | ORAL_TABLET | Freq: Every day | ORAL | 0 refills | Status: DC

## 2017-05-17 MED ORDER — IOPAMIDOL (ISOVUE-300) INJECTION 61%
INTRAVENOUS | Status: AC
  Administered 2017-05-17: 100 mL via INTRAVENOUS
  Filled 2017-05-17: qty 100

## 2017-05-17 MED ORDER — CYCLOBENZAPRINE HCL 10 MG PO TABS: 10.0000 mg | ORAL_TABLET | Freq: Two times a day (BID) | ORAL | 0 refills | Status: DC | PRN

## 2017-05-17 NOTE — ED Notes (Signed)
ED Provider at bedside. 

## 2017-05-17 NOTE — ED Triage Notes (Signed)
Patient presents with c/o back pain and increased urination

## 2017-05-17 NOTE — ED Provider Notes (Signed)
[facility] DEPT Provider Note   CSN: [id number] Arrival date & time: 05/17/17  6270     History   Chief Complaint Chief Complaint  Patient presents with  . Back Pain    HPI Tanya Diaz is a 41 y.o. female.  Patient is a 41 year old female with multiple medical problems including a history of colon cancer with resection approximately 6 years ago, spinal fusion approximately 7 years ago, hypertension and polycythemia vera presenting today with several vague complaints. Initially she came because she has had pain in her lower back around the sacrum for the last 2-3 days that started after taking a trip in sitting in the car on the way to [place]. It makes both of her lower legs ache but she denies any weakness in her legs or incontinence of stool. She does feel like she is urinating a lot but denies any dysuria or urinary retention type symptoms. She is also noted over the last week to have abdominal distention but denies pain. She's also had some nausea but denies vomiting. She denies shortness of breath, cough, fever, chest pain. She denies any bloody stool. She has been taking Tylenol for the pain but states that she takes 1-2 Tylenol per day. She does not drink alcohol. She does not use drugs or inject drugs   The history is provided by the patient.  Back Pain   This is a new problem. The current episode started 2 days ago. The problem occurs constantly. The problem has been rapidly worsening. The pain is associated with no known injury. The pain is present in the lumbar spine. The quality of the pain is described as shooting and aching. The pain radiates to the left thigh and right thigh. The pain is at a severity of 5/10. The pain is moderate. The symptoms are aggravated by certain positions, twisting and bending. The pain is worse during the night. Associated symptoms include abdominal swelling. Pertinent negatives include no chest pain, no fever, no numbness, no abdominal  pain, no bowel incontinence, no bladder incontinence, no dysuria, no paresis and no weakness. Treatments tried: tylenol. The treatment provided no relief.    Past Medical History:  Diagnosis Date  . Anxiety   . Anxiety   . Cancer [facility])    Colon  . Diverticulosis   . Fibromyalgia   . Hypertension   . Neuropathy   . Polycythemia vera ([place])     There are no active problems to display for this patient.   Past Surgical History:  Procedure Laterality Date  . COLON SURGERY    . FOOT SURGERY    . HEMORROIDECTOMY    . SPINAL FUSION      OB History    No data available       Home Medications    Prior to Admission medications   Medication Sig Start Date End Date Taking? Authorizing Provider  acetaminophen (TYLENOL) 500 MG tablet Take 500 mg by mouth every 6 (six) hours as needed for mild pain.    [provider]  albuterol (PROVENTIL HFA;VENTOLIN HFA) 108 (90 Base) MCG/ACT inhaler Inhale 1 puff into the lungs as needed. 10/05/16   [provider]  carvedilol (COREG) 6.25 MG tablet Take 1 tablet (6.25 mg total) by mouth 2 (two) times daily with a meal. 10/20/16   Ward, Ozella Almond, PA-C  gabapentin (NEURONTIN) 300 MG capsule Take 300 mg by mouth 3 (three) times daily.    [provider]  hydrOXYzine (VISTARIL) 25 MG  capsule Take 25 mg by mouth 3 (three) times daily as needed for anxiety.    [provider]  methocarbamol (ROBAXIN) 500 MG tablet Take 2 tablets (1,000 mg total) by mouth 4 (four) times daily as needed (Pain). Patient not taking: Reported on 02/28/2017 02/22/17   Pisciotta, Elmyra Ricks, PA-C  ondansetron (ZOFRAN) 4 MG tablet Take 1 tablet (4 mg total) by mouth every 6 (six) hours. 02/28/17   Charlann Lange, PA-C  oxyCODONE-acetaminophen (PERCOCET/ROXICET) 5-325 MG tablet Take 1 tablet by mouth every 4 (four) hours as needed for severe pain. 02/28/17   Charlann Lange, PA-C    Family History Family History  Problem Relation Age of Onset  .  Hypertension Other   . Cancer Other     Social History Social History  Substance Use Topics  . Smoking status: Current Every Day Smoker  . Smokeless tobacco: Current User  . Alcohol use No     Allergies   Banana; Ciprofloxacin; Latex; Nsaids; Amoxicillin; and Penicillins   Review of Systems Review of Systems  Constitutional: Negative for fever.  Cardiovascular: Negative for chest pain.  Gastrointestinal: Negative for abdominal pain and bowel incontinence.  Genitourinary: Negative for bladder incontinence and dysuria.  Musculoskeletal: Positive for back pain.  Neurological: Negative for weakness and numbness.  All other systems reviewed and are negative.    Physical Exam Updated Vital Signs BP (!) 124/109   Pulse 89   Temp (!) 97.4 F (36.3 C) (Oral)   Resp 16   Ht 5\' 3"  (1.6 m)   Wt 79.4 kg (175 lb)   LMP 04/29/2017   SpO2 99%   BMI 31.00 kg/m   Physical Exam  Constitutional: She is oriented to person, place, and time. She appears well-developed and well-nourished. No distress.  HENT:  Head: Normocephalic and atraumatic.  Mouth/Throat: Oropharynx is clear and moist.  Eyes: Pupils are equal, round, and reactive to light. Conjunctivae and EOM are normal.  Neck: Normal range of motion. Neck supple.  Cardiovascular: Normal rate, regular rhythm and intact distal pulses.   No murmur heard. Pulmonary/Chest: Effort normal and breath sounds normal. No respiratory distress. She has no wheezes. She has no rales.  Abdominal: Soft. She exhibits distension. There is no tenderness. There is no rebound, no guarding and negative Murphy's sign.  Musculoskeletal: Normal range of motion. She exhibits no edema or tenderness.       Back:  Neurological: She is alert and oriented to person, place, and time. She has normal strength. No sensory deficit.  Skin: Skin is warm and dry. No rash noted. No erythema.  Psychiatric: She has a normal mood and affect. Her behavior is normal.    Nursing note and vitals reviewed.    ED Treatments / Results  Labs (all labs ordered are listed, but only abnormal results are displayed) Labs Reviewed  COMPREHENSIVE METABOLIC PANEL - Abnormal; Notable for the following:       Result Value   Glucose, Bld 122 (*)    AST 170 (*)    ALT 245 (*)    All other components within normal limits  CBC - Abnormal; Notable for the following:    WBC 11.9 (*)    All other components within normal limits  URINALYSIS, ROUTINE W REFLEX MICROSCOPIC  POC URINE PREG, ED    EKG  EKG Interpretation None       Radiology Ct Abdomen Pelvis W Contrast  Result Date: 05/17/2017 CLINICAL DATA:  Midline back pain with increased urination. Nausea  and vomiting x1. EXAM: CT ABDOMEN AND PELVIS WITH CONTRAST TECHNIQUE: Multidetector CT imaging of the abdomen and pelvis was performed using the standard protocol following bolus administration of intravenous contrast. CONTRAST:  100 ml ISOVUE-300 IOPAMIDOL (ISOVUE-300) INJECTION 61% COMPARISON:  Abdominopelvic CT 02/16/2017. FINDINGS: Lower chest: Clear lung bases. No significant pleural or pericardial effusion. Hepatobiliary: The liver is normal in density without focal abnormality. The gallbladder is incompletely distended, likely accounting for mild prominence of its wall. No evidence of calcified gallstone, surrounding inflammation or biliary dilatation. Pancreas: Unremarkable. No pancreatic ductal dilatation or surrounding inflammatory changes. Spleen: Normal in size without focal abnormality. Adrenals/Urinary Tract: Both adrenal glands appear normal. The kidneys appear normal without evidence of urinary tract calculus, suspicious lesion or hydronephrosis. No bladder abnormalities are seen. Stomach/Bowel: No evidence of bowel wall thickening, distention or surrounding inflammatory change. Small hiatal hernia. The appendix appears normal. There is stool throughout the colon. Vascular/Lymphatic: There are no enlarged  abdominal or pelvic lymph nodes. No significant vascular findings are present. Reproductive: The uterus and ovaries demonstrate no significant findings. Small collapsing right ovarian follicle. Other: No evidence of abdominal wall mass or hernia. No ascites. Musculoskeletal: No acute or significant osseous findings. No gross abnormalities in the spine. IMPRESSION: 1. No acute findings or explanation for the patient's symptoms. 2. Incomplete distention of the gallbladder. Electronically Signed   By: Richardean Sale M.D.   On: 05/17/2017 11:44    Procedures Procedures (including critical care time)  Medications Ordered in ED Medications  iopamidol (ISOVUE-300) 61 % injection (not administered)     Initial Impression / Assessment and Plan / ED Course  I have reviewed the triage vital signs and the nursing notes.  Pertinent labs & imaging results that were available during my care of the patient were reviewed by me and considered in my medical decision making (see chart for details).     Patient presenting today with lower back pain going into her legs which is most likely radicular in nature after a long ride in the car and related to her prior spinal fusion. She has no neurologic deficits or history concerning for infection. She is afebrile with normal vital signs. However patient has had some abdominal bloating and early satiate the with nausea but no vomiting. She has a history of colon cancer resected approximately 6-7 years ago. Patient today has elevated LFTs which she does not know of ever having that before. She denied any symptoms suggestive of pancreatitis or cholecystitis. She does not take excessive Tylenol and does not drink alcohol. She is not on any medication that would otherwise affect the liver. We will do a CT to further evaluate to ensure no signs of metastatic disease. Patient has no evidence of urinary tract infection and urine pregnancy test is negative.  12:04 PM Imaging  neg for acute pathology.  Discussed f/u with PCP to trend LFTs.  Cautioned avoiding EtOH and tylenol.  Treated with short course of prednsine and flexeril  Final Clinical Impressions(s) / ED Diagnoses   Final diagnoses:  Acute bilateral low back pain with bilateral sciatica  Transaminitis    New Prescriptions New Prescriptions   CYCLOBENZAPRINE (FLEXERIL) 10 MG TABLET    Take 1 tablet (10 mg total) by mouth 2 (two) times daily as needed for muscle spasms.   PREDNISONE (DELTASONE) 20 MG TABLET    Take 2 tablets (40 mg total) by mouth daily.     Blanchie Dessert, MD 05/17/17 [phone]

## 2017-05-17 NOTE — ED Notes (Signed)
Pt reports that she was having pain in left ovary that then went into right ovary. She also reports pain in legs bilaterally. She states she has had a fullness "like I ate too much" with some nausea.

## 2017-05-17 NOTE — ED Notes (Signed)
Pt ambulated to RR from room with steady gait. PT also reports feeling nauseous.

## 2017-05-17 NOTE — ED Notes (Signed)
Patient transported to CT 

## 2017-05-24 ENCOUNTER — Emergency Department (HOSPITAL_COMMUNITY)
Admission: EM | Admit: 2017-05-24 | Discharge: 2017-05-24 | Disposition: A | Discharge status: Home / Self Care | Payer: Medicare Other | Attending: Emergency Medicine | Admitting: Emergency Medicine

## 2017-05-24 ENCOUNTER — Encounter (HOSPITAL_COMMUNITY): Payer: Self-pay | Admitting: *Deleted

## 2017-05-24 ENCOUNTER — Emergency Department (HOSPITAL_COMMUNITY): Payer: Medicare Other

## 2017-05-24 DIAGNOSIS — M25521 Pain in right elbow: Secondary | ICD-10-CM

## 2017-05-24 DIAGNOSIS — I10 Essential (primary) hypertension: Secondary | ICD-10-CM | POA: Insufficient documentation

## 2017-05-24 DIAGNOSIS — Z79899 Other long term (current) drug therapy: Secondary | ICD-10-CM | POA: Insufficient documentation

## 2017-05-24 DIAGNOSIS — F17210 Nicotine dependence, cigarettes, uncomplicated: Secondary | ICD-10-CM | POA: Insufficient documentation

## 2017-05-24 DIAGNOSIS — M25561 Pain in right knee: Secondary | ICD-10-CM

## 2017-05-24 DIAGNOSIS — Z91040 Latex allergy status: Secondary | ICD-10-CM | POA: Insufficient documentation

## 2017-05-24 LAB — PREGNANCY, URINE: Preg Test, Ur: NEGATIVE

## 2017-05-24 MED ORDER — OXYCODONE-ACETAMINOPHEN 5-325 MG PO TABS
1.0000 | ORAL_TABLET | Freq: Once | ORAL | Status: AC
  Administered 2017-05-24: 1 via ORAL
  Filled 2017-05-24: qty 1

## 2017-05-24 MED ORDER — CYCLOBENZAPRINE HCL 10 MG PO TABS
10.0000 mg | ORAL_TABLET | Freq: Once | ORAL | Status: AC
  Administered 2017-05-24: 10 mg via ORAL
  Filled 2017-05-24: qty 1

## 2017-05-24 MED ORDER — ACETAMINOPHEN 500 MG PO TABS: 500.0000 mg | ORAL_TABLET | Freq: Four times a day (QID) | ORAL | 0 refills | Status: DC | PRN

## 2017-05-24 MED ORDER — BACLOFEN 10 MG PO TABS: 10.0000 mg | ORAL_TABLET | Freq: Two times a day (BID) | ORAL | 0 refills | Status: DC
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
## 2017-05-24 NOTE — ED Notes (Signed)
Bed: WTR7 Expected date:  Expected time:  Means of arrival:  Comments: 

## 2017-05-24 NOTE — Discharge Instructions (Signed)
Take [phone] mg of Tylenol every 6 hours as needed for pain. Do not exceed 4000 mg of Tylenol daily. You may take Baclofen up to twice daily as needed for muscle spasms. This medication may make you drowsy, so I typically only recommended at night. If this medication makes you drowsy throughout the day, no driving, drinking alcohol, or operating heavy machinery. You may also cut these tablets in half. Ice to areas of soreness for the next few days and then may move to heat. Do some gentle stretching throughout the day, especially during hot showers or baths. Take short frequent walks and avoid prolonged periods of sitting or laying. Expect to be sore for the next few day and follow up with primary care physician for recheck of ongoing symptoms but return to ER for emergent changing or worsening of symptoms such as severe headache that gets worse, altered mental status/behaving unusually, persistent vomiting, excessive drowsiness, numbness to the arms or legs, unsteady gait, or slurred speech. If you're numbness also persists, follow up with a primary care physician or orthopedist or neurosurgeon for reevaluation and possible MRI.
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
## 2017-05-24 NOTE — ED Notes (Signed)
[place] to place pt on knee sleeve and crutches

## 2017-05-25 ENCOUNTER — Emergency Department (HOSPITAL_COMMUNITY)
Admission: EM | Admit: 2017-05-25 | Discharge: 2017-05-25 | Discharge status: Against Medical Advice | Payer: Medicare Other

## 2017-05-25 NOTE — ED Notes (Signed)
Pt called from lobby with no response x2

## 2017-06-11 ENCOUNTER — Emergency Department (HOSPITAL_COMMUNITY): Payer: Medicare Other

## 2017-06-11 ENCOUNTER — Encounter (HOSPITAL_COMMUNITY): Payer: Self-pay

## 2017-06-11 ENCOUNTER — Emergency Department (HOSPITAL_COMMUNITY)
Admission: EM | Admit: 2017-06-11 | Discharge: 2017-06-13 | Disposition: A | Discharge status: Home / Self Care | Payer: Medicare Other | Attending: Emergency Medicine | Admitting: Emergency Medicine

## 2017-06-11 DIAGNOSIS — F319 Bipolar disorder, unspecified: Secondary | ICD-10-CM | POA: Diagnosis present

## 2017-06-11 DIAGNOSIS — Z79899 Other long term (current) drug therapy: Secondary | ICD-10-CM | POA: Diagnosis not present

## 2017-06-11 DIAGNOSIS — Z91040 Latex allergy status: Secondary | ICD-10-CM | POA: Insufficient documentation

## 2017-06-11 DIAGNOSIS — F17200 Nicotine dependence, unspecified, uncomplicated: Secondary | ICD-10-CM | POA: Diagnosis not present

## 2017-06-11 DIAGNOSIS — R443 Hallucinations, unspecified: Secondary | ICD-10-CM | POA: Diagnosis not present

## 2017-06-11 DIAGNOSIS — F1910 Other psychoactive substance abuse, uncomplicated: Secondary | ICD-10-CM | POA: Diagnosis not present

## 2017-06-11 DIAGNOSIS — I10 Essential (primary) hypertension: Secondary | ICD-10-CM | POA: Insufficient documentation

## 2017-06-11 DIAGNOSIS — F4310 Post-traumatic stress disorder, unspecified: Secondary | ICD-10-CM | POA: Insufficient documentation

## 2017-06-11 DIAGNOSIS — F17210 Nicotine dependence, cigarettes, uncomplicated: Secondary | ICD-10-CM | POA: Diagnosis not present

## 2017-06-11 LAB — URINALYSIS, ROUTINE W REFLEX MICROSCOPIC
BILIRUBIN URINE: NEGATIVE
Glucose, UA: NEGATIVE mg/dL
Hgb urine dipstick: NEGATIVE
KETONES UR: 5 mg/dL — AB
Leukocytes, UA: NEGATIVE
NITRITE: NEGATIVE
PH: 5 (ref 5.0–8.0)
Protein, ur: NEGATIVE mg/dL
Specific Gravity, Urine: 1.027 (ref 1.005–1.030)

## 2017-06-11 LAB — RAPID URINE DRUG SCREEN, HOSP PERFORMED
Amphetamines: NOT DETECTED
BARBITURATES: NOT DETECTED
BENZODIAZEPINES: NOT DETECTED
Cocaine: NOT DETECTED
Opiates: NOT DETECTED
Tetrahydrocannabinol: NOT DETECTED

## 2017-06-11 LAB — COMPREHENSIVE METABOLIC PANEL
ALBUMIN: 3.8 g/dL (ref 3.5–5.0)
ALK PHOS: 103 U/L (ref 38–126)
ALT: 35 U/L (ref 14–54)
AST: 24 U/L (ref 15–41)
Anion gap: 10 (ref 5–15)
BILIRUBIN TOTAL: 0.8 mg/dL (ref 0.3–1.2)
BUN: 18 mg/dL (ref 6–20)
CO2: 25 mmol/L (ref 22–32)
Calcium: 8.9 mg/dL (ref 8.9–10.3)
Chloride: 103 mmol/L (ref 101–111)
Creatinine, Ser: 0.73 mg/dL (ref 0.44–1.00)
GFR calc Af Amer: >60 mL/min (ref >60)
GFR calc non Af Amer: >60 mL/min (ref >60)
GLUCOSE: 115 mg/dL — AB (ref 65–99)
POTASSIUM: 3.5 mmol/L (ref 3.5–5.1)
Sodium: 138 mmol/L (ref 135–145)
TOTAL PROTEIN: 7.7 g/dL (ref 6.5–8.1)

## 2017-06-11 LAB — CBC
HEMATOCRIT: 39.1 % (ref 36.0–46.0)
Hemoglobin: 13.3 g/dL (ref 12.0–15.0)
MCH: 28.4 pg (ref 26.0–34.0)
MCHC: 34 g/dL (ref 30.0–36.0)
MCV: 83.4 fL (ref 78.0–100.0)
Platelets: 463 10*3/uL — ABNORMAL HIGH (ref 150–400)
RBC: 4.69 MIL/uL (ref 3.87–5.11)
RDW: 13.9 % (ref 11.5–15.5)
WBC: 16.2 10*3/uL — ABNORMAL HIGH (ref 4.0–10.5)

## 2017-06-11 LAB — SALICYLATE LEVEL

## 2017-06-11 LAB — I-STAT BETA HCG BLOOD, ED (MC, WL, AP ONLY)

## 2017-06-11 LAB — ETHANOL: Alcohol, Ethyl (B): <10 mg/dL (ref <10)

## 2017-06-11 LAB — PREGNANCY, URINE: Preg Test, Ur: NEGATIVE

## 2017-06-11 LAB — ACETAMINOPHEN LEVEL

## 2017-06-11 MED ORDER — NICOTINE 21 MG/24HR TD PT24
21.0000 mg | MEDICATED_PATCH | Freq: Every day | TRANSDERMAL | Status: DC
  Administered 2017-06-12: 21 mg via TRANSDERMAL
  Filled 2017-06-11: qty 1

## 2017-06-11 MED ORDER — ALBUTEROL SULFATE HFA 108 (90 BASE) MCG/ACT IN AERS: 1.0000 | INHALATION_SPRAY | RESPIRATORY_TRACT | Status: DC | PRN

## 2017-06-11 MED ORDER — CARVEDILOL 6.25 MG PO TABS
6.2500 mg | ORAL_TABLET | Freq: Two times a day (BID) | ORAL | Status: DC
  Administered 2017-06-12 – 2017-06-13 (×2): 6.25 mg via ORAL
  Filled 2017-06-11 (×2): qty 1

## 2017-06-11 MED ORDER — ACETAMINOPHEN 325 MG PO TABS: 650.0000 mg | ORAL_TABLET | ORAL | Status: DC | PRN

## 2017-06-11 MED ORDER — ZOLPIDEM TARTRATE 5 MG PO TABS
5.0000 mg | ORAL_TABLET | Freq: Every evening | ORAL | Status: DC | PRN
  Administered 2017-06-12: 5 mg via ORAL
  Filled 2017-06-11: qty 1

## 2017-06-11 MED ORDER — HYDROXYZINE PAMOATE 25 MG PO CAPS: 25.0000 mg | ORAL_CAPSULE | Freq: Three times a day (TID) | ORAL | Status: DC | PRN

## 2017-06-11 MED ORDER — GABAPENTIN 300 MG PO CAPS
300.0000 mg | ORAL_CAPSULE | Freq: Three times a day (TID) | ORAL | Status: DC
  Administered 2017-06-11 – 2017-06-13 (×5): 300 mg via ORAL
  Filled 2017-06-11 (×5): qty 1

## 2017-06-11 MED ORDER — ALUM & MAG HYDROXIDE-SIMETH 200-200-20 MG/5ML PO SUSP: 30.0000 mL | Freq: Four times a day (QID) | ORAL | Status: DC | PRN

## 2017-06-11 MED ORDER — ONDANSETRON HCL 4 MG PO TABS: 4.0000 mg | ORAL_TABLET | Freq: Three times a day (TID) | ORAL | Status: DC | PRN

## 2017-06-11 MED ORDER — BACLOFEN 10 MG PO TABS
10.0000 mg | ORAL_TABLET | Freq: Two times a day (BID) | ORAL | Status: DC
  Administered 2017-06-11 – 2017-06-13 (×4): 10 mg via ORAL
  Filled 2017-06-11 (×4): qty 1

## 2017-06-11 NOTE — ED Notes (Signed)
Bed: [facility] Expected date:  Expected time:  Means of arrival:  Comments: IVC 
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
## 2017-06-11 NOTE — ED Notes (Signed)
Pt's contact:  Mellody Life (sister)---- tel# [phone] (NOTE:  Please call her of pt's placement; ok to leave voicemail.)
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
## 2017-06-11 NOTE — ED Notes (Signed)
Pt presents with complaint of hallucinations, not caring for self and abusing drugs.  Awake, alert & responsive, no distress noted, calm & cooperative.  Monitoring for safety, Q 15 min checks in effect.  Safety check for contraband completed, no items found.
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
## 2017-06-12 MED ORDER — HYDROXYZINE HCL 25 MG PO TABS
25.0000 mg | ORAL_TABLET | Freq: Three times a day (TID) | ORAL | Status: DC | PRN
  Administered 2017-06-12: 25 mg via ORAL
  Filled 2017-06-12: qty 1

## 2017-06-12 NOTE — ED Notes (Signed)
Pt sleeping at present, no distress noted, calm & cooperative.  Monitoring for safety, Q 15 min checks in effect. 

## 2017-06-13 DIAGNOSIS — F419 Anxiety disorder, unspecified: Secondary | ICD-10-CM | POA: Diagnosis not present

## 2017-06-13 DIAGNOSIS — R443 Hallucinations, unspecified: Secondary | ICD-10-CM

## 2017-06-13 DIAGNOSIS — F17210 Nicotine dependence, cigarettes, uncomplicated: Secondary | ICD-10-CM

## 2017-06-13 DIAGNOSIS — F319 Bipolar disorder, unspecified: Secondary | ICD-10-CM | POA: Diagnosis present

## 2017-06-13 DIAGNOSIS — F4310 Post-traumatic stress disorder, unspecified: Secondary | ICD-10-CM

## 2017-06-13 DIAGNOSIS — F1910 Other psychoactive substance abuse, uncomplicated: Secondary | ICD-10-CM

## 2017-06-13 MED ORDER — LURASIDONE HCL 20 MG PO TABS: 40.0000 mg | ORAL_TABLET | Freq: Every day | ORAL | 0 refills | Status: DC

## 2017-06-13 NOTE — Discharge Instructions (Signed)
For your behavioral health needs, you are advised to follow up with the [facility]:       [facility]      [place], [place] [address]      [phone]

## 2017-06-13 NOTE — ED Notes (Signed)
Pt discharged safely with discharge instructions.  All belongings were returned to patient.  Pt was calm and cooperative. Alert and oriented.  Denies S/I, H/I, and AVH
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
## 2017-06-13 NOTE — BH Assessment (Signed)
[facility] Assessment Progress Note  Per Buford Dresser, DO, this pt does not require psychiatric hospitalization at this time.  Pt presents under IVC initiated by pt's caretaker, which Dr Mariea Clonts has rescinded.  Pt is to be discharged from [facility] with recommendation to follow up with the [facility].  This has been included in pt's discharge instructions.  Pt would also benefit from seeing Peer Support Specialists; they will be asked to speak to pt.  Pt's nurse, Nena Jordan, has been notified.  Jalene Mullet, [place] Triage Specialist [phone]

## 2017-06-13 NOTE — BHH Suicide Risk Assessment (Signed)
Suicide Risk Assessment  Discharge Assessment   [facility] Discharge Suicide Risk Assessment   Principal Problem: Posttraumatic stress disorder Discharge Diagnoses:  Patient Active Problem List   Diagnosis Date Noted  . Posttraumatic stress disorder [F43.10] 06/13/2017    Priority: High    Total Time spent with patient: 45 minutes   Musculoskeletal: Strength & Muscle Tone: within normal limits Gait & Station: normal Patient leans: N/A  Psychiatric Specialty Exam: Physical Exam  Constitutional: She is oriented to person, place, and time. She appears well-developed and well-nourished.  HENT:  Head: Normocephalic.  Neck: Normal range of motion.  Respiratory: Effort normal.  Musculoskeletal: Normal range of motion.  Neurological: She is alert and oriented to person, place, and time.  Psychiatric: She has a normal mood and affect. Her speech is normal and behavior is normal. Judgment and thought content normal. Cognition and memory are normal.    Review of Systems  Psychiatric/Behavioral: Positive for substance abuse.  All other systems reviewed and are negative.   Blood pressure 125/81, pulse 83, temperature 98.4 F (36.9 C), temperature source Oral, resp. rate 17, last menstrual period 05/28/2017, SpO2 98 %.There is no height or weight on file to calculate BMI.  General Appearance: Casual  Eye Contact:  Good  Speech:  Normal Rate  Volume:  Normal  Mood:  Euthymic  Affect:  Congruent  Thought Process:  Coherent and Descriptions of Associations: Intact  Orientation:  Full (Time, Place, and Person)  Thought Content:  WDL and Logical  Suicidal Thoughts:  No  Homicidal Thoughts:  No  Memory:  Immediate;   Good Recent;   Good Remote;   Good  Judgement:  Fair  Insight:  Fair  Psychomotor Activity:  Normal  Concentration:  Concentration: Good and Attention Span: Good  Recall:  Good  Fund of Knowledge:  Fair  Language:  Good  Akathisia:  No  Handed:  Right  AIMS (if  indicated):     Assets:  Physical Health Resilience Social Support  ADL's:  Intact  Cognition:  WNL  Sleep:      Mental Status Per Nursing Assessment::   On Admission:   hallucinations, alleged substance abuse  Demographic Factors:  NA  Loss Factors: NA  Historical Factors: NA  Risk Reduction Factors:   Sense of responsibility to family, Positive social support and Positive therapeutic relationship  Continued Clinical Symptoms:  None  Cognitive Features That Contribute To Risk:  None    Suicide Risk:  Minimal: No identifiable suicidal ideation.  Patients presenting with no risk factors but with morbid ruminations; may be classified as minimal risk based on the severity of the depressive symptoms    Plan Of Care/Follow-up recommendations:  Activity:  as tolerated Diet:  heart healthy diet  Sameria Morss, NP 06/13/2017, 10:31 AM

## 2017-07-04 ENCOUNTER — Emergency Department (HOSPITAL_COMMUNITY)
Admission: EM | Admit: 2017-07-04 | Discharge: 2017-07-05 | Disposition: A | Discharge status: Other Acute Inpatient Hospital | Payer: Medicare Other | Attending: Emergency Medicine | Admitting: Emergency Medicine

## 2017-07-04 ENCOUNTER — Ambulatory Visit (HOSPITAL_COMMUNITY)
Admission: EM | Admit: 2017-07-04 | Discharge: 2017-07-04 | Disposition: A | Discharge status: Home / Self Care | Payer: No Typology Code available for payment source | Source: Ambulatory Visit | Attending: Emergency Medicine | Admitting: Emergency Medicine

## 2017-07-04 ENCOUNTER — Encounter (HOSPITAL_COMMUNITY): Payer: Self-pay

## 2017-07-04 DIAGNOSIS — F4310 Post-traumatic stress disorder, unspecified: Secondary | ICD-10-CM | POA: Diagnosis not present

## 2017-07-04 DIAGNOSIS — F17210 Nicotine dependence, cigarettes, uncomplicated: Secondary | ICD-10-CM | POA: Insufficient documentation

## 2017-07-04 DIAGNOSIS — R45851 Suicidal ideations: Secondary | ICD-10-CM | POA: Diagnosis not present

## 2017-07-04 DIAGNOSIS — Z85038 Personal history of other malignant neoplasm of large intestine: Secondary | ICD-10-CM | POA: Insufficient documentation

## 2017-07-04 DIAGNOSIS — F319 Bipolar disorder, unspecified: Secondary | ICD-10-CM | POA: Diagnosis not present

## 2017-07-04 DIAGNOSIS — I10 Essential (primary) hypertension: Secondary | ICD-10-CM | POA: Insufficient documentation

## 2017-07-04 DIAGNOSIS — Z79899 Other long term (current) drug therapy: Secondary | ICD-10-CM | POA: Insufficient documentation

## 2017-07-04 DIAGNOSIS — T7621XA Adult sexual abuse, suspected, initial encounter: Secondary | ICD-10-CM | POA: Diagnosis present

## 2017-07-04 DIAGNOSIS — Z0441 Encounter for examination and observation following alleged adult rape: Secondary | ICD-10-CM | POA: Diagnosis present

## 2017-07-04 HISTORY — DX: Post-traumatic stress disorder, unspecified: F43.10

## 2017-07-04 HISTORY — DX: Suicide attempt, initial encounter: T14.91XA

## 2017-07-04 HISTORY — DX: Adult sexual abuse, confirmed, initial encounter: T74.21XA

## 2017-07-04 LAB — COMPREHENSIVE METABOLIC PANEL
ALT: 42 U/L (ref 14–54)
AST: 25 U/L (ref 15–41)
Albumin: 4.4 g/dL (ref 3.5–5.0)
Alkaline Phosphatase: 102 U/L (ref 38–126)
Anion gap: 9 (ref 5–15)
BILIRUBIN TOTAL: 0.7 mg/dL (ref 0.3–1.2)
BUN: 14 mg/dL (ref 6–20)
CO2: 24 mmol/L (ref 22–32)
CREATININE: 0.65 mg/dL (ref 0.44–1.00)
Calcium: 9.6 mg/dL (ref 8.9–10.3)
Chloride: 105 mmol/L (ref 101–111)
Glucose, Bld: 126 mg/dL — ABNORMAL HIGH (ref 65–99)
POTASSIUM: 3.2 mmol/L — AB (ref 3.5–5.1)
Sodium: 138 mmol/L (ref 135–145)
TOTAL PROTEIN: 8.2 g/dL — AB (ref 6.5–8.1)

## 2017-07-04 LAB — CBC WITH DIFFERENTIAL/PLATELET
Basophils Absolute: 0 10*3/uL (ref 0.0–0.1)
Basophils Relative: 0 %
Eosinophils Absolute: 0.2 10*3/uL (ref 0.0–0.7)
Eosinophils Relative: 1 %
HEMATOCRIT: 40.5 % (ref 36.0–46.0)
HEMOGLOBIN: 14 g/dL (ref 12.0–15.0)
LYMPHS ABS: 3.8 10*3/uL (ref 0.7–4.0)
LYMPHS PCT: 22 %
MCH: 28.7 pg (ref 26.0–34.0)
MCHC: 34.6 g/dL (ref 30.0–36.0)
MCV: 83.2 fL (ref 78.0–100.0)
Monocytes Absolute: 1.5 10*3/uL — ABNORMAL HIGH (ref 0.1–1.0)
Monocytes Relative: 9 %
NEUTROS PCT: 68 %
Neutro Abs: 11.6 10*3/uL — ABNORMAL HIGH (ref 1.7–7.7)
PLATELETS: 390 10*3/uL (ref 150–400)
RBC: 4.87 MIL/uL (ref 3.87–5.11)
RDW: 14.1 % (ref 11.5–15.5)
WBC: 17.1 10*3/uL — AB (ref 4.0–10.5)

## 2017-07-04 LAB — RAPID URINE DRUG SCREEN, HOSP PERFORMED
Amphetamines: NOT DETECTED
Barbiturates: NOT DETECTED
Benzodiazepines: NOT DETECTED
Cocaine: NOT DETECTED
Opiates: NOT DETECTED
Tetrahydrocannabinol: NOT DETECTED

## 2017-07-04 LAB — POC URINE PREG, ED: Preg Test, Ur: NEGATIVE

## 2017-07-04 LAB — ETHANOL

## 2017-07-04 MED ORDER — BENZTROPINE MESYLATE 1 MG PO TABS
1.0000 mg | ORAL_TABLET | Freq: Two times a day (BID) | ORAL | Status: DC
  Administered 2017-07-04 – 2017-07-05 (×2): 1 mg via ORAL
  Filled 2017-07-04 (×3): qty 1

## 2017-07-04 MED ORDER — ALBUTEROL SULFATE HFA 108 (90 BASE) MCG/ACT IN AERS: 1.0000 | INHALATION_SPRAY | RESPIRATORY_TRACT | Status: DC | PRN

## 2017-07-04 MED ORDER — LURASIDONE HCL 20 MG PO TABS
40.0000 mg | ORAL_TABLET | Freq: Every day | ORAL | Status: DC
  Filled 2017-07-04: qty 2

## 2017-07-04 MED ORDER — ACETAMINOPHEN 325 MG PO TABS
650.0000 mg | ORAL_TABLET | Freq: Once | ORAL | Status: AC
  Administered 2017-07-04: 650 mg via ORAL
  Filled 2017-07-04: qty 2

## 2017-07-04 MED ORDER — AMLODIPINE BESYLATE 5 MG PO TABS
2.5000 mg | ORAL_TABLET | Freq: Every day | ORAL | Status: DC
  Administered 2017-07-05: 2.5 mg via ORAL
  Filled 2017-07-04: qty 1

## 2017-07-04 NOTE — BHH Counselor (Signed)
Clinician attempted to engage the pt in TTS consult to no avail. Clincian called pt's name twice however initially pt made groggy noises and continued sleeping. Per NT pt has not slept since yesterday. Clinician touched basis with Raquel Sarna, RN and asked once pt is roused and able to engage to inform clinician and the TTS consult will be completed.   Vertell Novak, MS, [facility], [facility] Triage Specialist [phone]

## 2017-07-04 NOTE — ED Notes (Signed)
CHARGE PATTI RN AWARE. DELAY IN CARE. PT WILL REMAIN IN CLOTHING. URINE WILL NOT BE COLLECTED. SANE RN AWARE OF PT HERE AT [place]. EDP ALSO AWARE.

## 2017-07-04 NOTE — ED Notes (Signed)
Patient sleeping, restless sleep at times, sitter at bedside.

## 2017-07-04 NOTE — ED Notes (Signed)
Sane nurse will come when she finishes at [place].

## 2017-07-04 NOTE — ED Notes (Signed)
MADE AWARE OF NEED FOR LABS
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
## 2017-07-04 NOTE — ED Notes (Signed)
Pt collected urine however did not save tissue. SANE made aware. SANE requested POC urine. SANE did not need urine for sampling at this time. SANE in room with pt
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
## 2017-07-04 NOTE — ED Triage Notes (Signed)
Pt admits to being suicidal with intent. Pt reports being here recently for IVC (medication reaction) family does not understand. People in her life she thought she could trust have betrayed her. Pt states she has lived in fear since she was 41 years of age. She has a therapist at the [place]. EDP Nanavati present to witnessed what appears to be a "manic state", rapid and pressured speech. It is difficulty to keep pt focused on subject. Pt does agree to SANE assessment and staying for medical clearance and she understands suicidal precautions. EDP Nanavati aware pt will stay in current clothing for SANE. [place]itter ordered

## 2017-07-04 NOTE — ED Notes (Signed)
SANE CONTINUES WITH THIS PT. EDP NANAVATI UNDERSTANDS DELAY WITH LABS AND EKG. SANE EXAM IN PROCESS
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
## 2017-07-05 ENCOUNTER — Encounter (HOSPITAL_COMMUNITY): Payer: Self-pay

## 2017-07-05 ENCOUNTER — Other Ambulatory Visit: Payer: Self-pay

## 2017-07-05 ENCOUNTER — Inpatient Hospital Stay (HOSPITAL_COMMUNITY)
Admission: AD | Admit: 2017-07-05 | Discharge: 2017-07-10 | DRG: 885 | Disposition: A | Discharge status: Home / Self Care | Payer: Medicare Other | Source: Intra-hospital | Attending: Psychiatry | Admitting: Psychiatry

## 2017-07-05 DIAGNOSIS — F4310 Post-traumatic stress disorder, unspecified: Secondary | ICD-10-CM | POA: Diagnosis present

## 2017-07-05 DIAGNOSIS — I10 Essential (primary) hypertension: Secondary | ICD-10-CM | POA: Diagnosis present

## 2017-07-05 DIAGNOSIS — Z91040 Latex allergy status: Secondary | ICD-10-CM

## 2017-07-05 DIAGNOSIS — Z981 Arthrodesis status: Secondary | ICD-10-CM

## 2017-07-05 DIAGNOSIS — F17210 Nicotine dependence, cigarettes, uncomplicated: Secondary | ICD-10-CM | POA: Diagnosis present

## 2017-07-05 DIAGNOSIS — Z881 Allergy status to other antibiotic agents status: Secondary | ICD-10-CM

## 2017-07-05 DIAGNOSIS — Z79899 Other long term (current) drug therapy: Secondary | ICD-10-CM

## 2017-07-05 DIAGNOSIS — F419 Anxiety disorder, unspecified: Secondary | ICD-10-CM | POA: Diagnosis present

## 2017-07-05 DIAGNOSIS — Z886 Allergy status to analgesic agent status: Secondary | ICD-10-CM | POA: Diagnosis not present

## 2017-07-05 DIAGNOSIS — Z91410 Personal history of adult physical and sexual abuse: Secondary | ICD-10-CM

## 2017-07-05 DIAGNOSIS — F319 Bipolar disorder, unspecified: Secondary | ICD-10-CM | POA: Diagnosis present

## 2017-07-05 DIAGNOSIS — F39 Unspecified mood [affective] disorder: Secondary | ICD-10-CM | POA: Diagnosis not present

## 2017-07-05 DIAGNOSIS — J069 Acute upper respiratory infection, unspecified: Secondary | ICD-10-CM | POA: Diagnosis present

## 2017-07-05 DIAGNOSIS — G629 Polyneuropathy, unspecified: Secondary | ICD-10-CM | POA: Diagnosis present

## 2017-07-05 DIAGNOSIS — Z880 Allergy status to penicillin: Secondary | ICD-10-CM | POA: Diagnosis not present

## 2017-07-05 DIAGNOSIS — G4700 Insomnia, unspecified: Secondary | ICD-10-CM | POA: Diagnosis present

## 2017-07-05 DIAGNOSIS — Z23 Encounter for immunization: Secondary | ICD-10-CM

## 2017-07-05 DIAGNOSIS — Z915 Personal history of self-harm: Secondary | ICD-10-CM

## 2017-07-05 DIAGNOSIS — D45 Polycythemia vera: Secondary | ICD-10-CM | POA: Diagnosis present

## 2017-07-05 DIAGNOSIS — M797 Fibromyalgia: Secondary | ICD-10-CM | POA: Diagnosis present

## 2017-07-05 MED ORDER — BENZTROPINE MESYLATE 1 MG PO TABS
1.0000 mg | ORAL_TABLET | Freq: Two times a day (BID) | ORAL | Status: DC
  Administered 2017-07-06: 1 mg via ORAL
  Filled 2017-07-05 (×7): qty 1

## 2017-07-05 MED ORDER — ALUM & MAG HYDROXIDE-SIMETH 200-200-20 MG/5ML PO SUSP: 30.0000 mL | ORAL | Status: DC | PRN

## 2017-07-05 MED ORDER — TRAZODONE HCL 50 MG PO TABS
50.0000 mg | ORAL_TABLET | Freq: Every evening | ORAL | Status: DC | PRN
  Administered 2017-07-05 – 2017-07-09 (×3): 50 mg via ORAL
  Filled 2017-07-05 (×5): qty 1

## 2017-07-05 MED ORDER — HYDROXYZINE HCL 25 MG PO TABS
25.0000 mg | ORAL_TABLET | Freq: Four times a day (QID) | ORAL | Status: DC | PRN
  Administered 2017-07-05 – 2017-07-09 (×3): 25 mg via ORAL
  Filled 2017-07-05 (×3): qty 1

## 2017-07-05 MED ORDER — ALBUTEROL SULFATE HFA 108 (90 BASE) MCG/ACT IN AERS: 1.0000 | INHALATION_SPRAY | RESPIRATORY_TRACT | Status: DC | PRN

## 2017-07-05 MED ORDER — PNEUMOCOCCAL VAC POLYVALENT 25 MCG/0.5ML IJ INJ
0.5000 mL | INJECTION | INTRAMUSCULAR | Status: AC
  Administered 2017-07-06: 0.5 mL via INTRAMUSCULAR

## 2017-07-05 MED ORDER — LURASIDONE HCL 20 MG PO TABS
40.0000 mg | ORAL_TABLET | Freq: Every day | ORAL | Status: DC
  Filled 2017-07-05 (×3): qty 2

## 2017-07-05 MED ORDER — ACETAMINOPHEN 325 MG PO TABS
650.0000 mg | ORAL_TABLET | Freq: Four times a day (QID) | ORAL | Status: DC | PRN
  Administered 2017-07-05 – 2017-07-08 (×3): 650 mg via ORAL
  Filled 2017-07-05 (×4): qty 2

## 2017-07-05 MED ORDER — NICOTINE 21 MG/24HR TD PT24
21.0000 mg | MEDICATED_PATCH | Freq: Every day | TRANSDERMAL | Status: DC
  Administered 2017-07-05 – 2017-07-10 (×6): 21 mg via TRANSDERMAL
  Filled 2017-07-05 (×10): qty 1

## 2017-07-05 MED ORDER — AMLODIPINE BESYLATE 2.5 MG PO TABS
2.5000 mg | ORAL_TABLET | Freq: Every day | ORAL | Status: DC
  Administered 2017-07-06 – 2017-07-10 (×5): 2.5 mg via ORAL
  Filled 2017-07-05 (×6): qty 1

## 2017-07-05 MED ORDER — MAGNESIUM HYDROXIDE 400 MG/5ML PO SUSP: 30.0000 mL | Freq: Every day | ORAL | Status: DC | PRN
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
## 2017-07-05 NOTE — BHH Group Notes (Signed)
Type of Therapy and Topic:  Group Therapy:  Overcoming Obstacles   Participation Level:  Did not attend   Description of Group:    In this group patients will be encouraged to explore what they see as obstacles to their own wellness and recovery. They will be guided to discuss their thoughts, feelings, and behaviors related to these obstacles. The group will process together ways to cope with barriers, with attention given to specific choices patients can make. Each patient will be challenged to identify changes they are motivated to make in order to overcome their obstacles. This group will be process-oriented, with patients participating in exploration of their own experiences as well as giving and receiving support and challenge from other group members.   Therapeutic Goals: 1. Patient will identify personal and current obstacles as they relate to admission. 2. Patient will identify barriers that currently interfere with their wellness or overcoming obstacles.  3. Patient will identify feelings, thought process and behaviors related to these barriers. 4. Patient will identify two changes they are willing to make to overcome these obstacles:      Summary of Patient Progress  Did not attend   Therapeutic Modalities:   Cognitive Behavioral Therapy Solution Focused Therapy Motivational Interviewing Relapse Prevention Therapy    Darin Engels MSW, [facility] 07/05/2017 3:24 PM
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
## 2017-07-05 NOTE — ED Notes (Signed)
Pt A&O x 3, no distress noted, agitated about clothing being inventoried.  Continues to be SI, no specific plan.  Monitoring for safety, Q 15 min checks in effect.  Safety check for contraband completed, no items found.
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
## 2017-07-05 NOTE — ED Notes (Signed)
Bed: [facility] Expected date:  Expected time:  Means of arrival:  Comments: gonzalez

## 2017-07-05 NOTE — ED Notes (Signed)
Report to RN Cyndia Bent, [facility], pending [place] transfer at 1pm.

## 2017-07-05 NOTE — Progress Notes (Signed)
Pt attended evening wrap up group and stated "this has been my first night here, im concerned about my cat!" She said she hasnt spoken with her counselor out of the hospital yet but in the morning she will give her a call. When asked if she could travel anywhere where would she go, she stated she'd like to be "[place] the clouds, just to get away for a moment" .

## 2017-07-05 NOTE — BH Assessment (Signed)
[place] Assessment Progress Note  Per Akintayo MD patient is recommended for a inpatient admission.

## 2017-07-05 NOTE — BH Assessment (Signed)
[place] Assessment Progress Note  Per [name], MD, this pt requires psychiatric hospitalization at this time.  Tanya Libra, RN, [facility] has assigned pt to [facility] Rm 402-2.  Pt has signed Voluntary Admission and Consent for Treatment, as well as Consent to Release Information to her psychiatrist and her PCP at the [facility] [place] [facility], and signed forms have been faxed to [facility].  Pt's nurse, [name], has been notified, and agrees to send original paperwork along with pt via B[place]y Pries, and to call report to [phone].  Jalene Mullet, [place] Triage Specialist [phone]

## 2017-07-05 NOTE — Progress Notes (Addendum)
Patient has continued to talk on the phone, calling several people.  Respirations even and unlabored.  No signs/symptoms of pain/distress noted on patient's face/body movements.  Patient has talked to [facility] and did contract for safety.
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
## 2017-07-05 NOTE — Progress Notes (Signed)
Patient ID: Tanya Diaz, female   DOB: Nov 05, 1975, 41 y.o.   MRN: [id number] Per State regulations 482.30 this chart was reviewed for medical necessity with respect to the patient's admission/duration of stay.    Next review date: 07/09/17  Debarah Crape, BSN, RN-BC  Case Manager

## 2017-07-06 DIAGNOSIS — F4310 Post-traumatic stress disorder, unspecified: Secondary | ICD-10-CM

## 2017-07-06 DIAGNOSIS — F17210 Nicotine dependence, cigarettes, uncomplicated: Secondary | ICD-10-CM

## 2017-07-06 DIAGNOSIS — Z91410 Personal history of adult physical and sexual abuse: Secondary | ICD-10-CM

## 2017-07-06 DIAGNOSIS — F319 Bipolar disorder, unspecified: Principal | ICD-10-CM

## 2017-07-06 MED ORDER — ARIPIPRAZOLE 10 MG PO TABS
20.0000 mg | ORAL_TABLET | Freq: Every day | ORAL | Status: DC
  Administered 2017-07-07 – 2017-07-10 (×4): 20 mg via ORAL
  Filled 2017-07-06 (×7): qty 2

## 2017-07-06 MED ORDER — ARIPIPRAZOLE 10 MG PO TABS
10.0000 mg | ORAL_TABLET | Freq: Once | ORAL | Status: AC
  Administered 2017-07-06: 10 mg via ORAL
  Filled 2017-07-06 (×2): qty 1

## 2017-07-06 MED ORDER — BENZTROPINE MESYLATE 0.5 MG PO TABS
0.5000 mg | ORAL_TABLET | Freq: Two times a day (BID) | ORAL | Status: AC
  Administered 2017-07-06 – 2017-07-08 (×4): 0.5 mg via ORAL
  Filled 2017-07-06 (×6): qty 1

## 2017-07-06 MED ORDER — GUANFACINE HCL 2 MG PO TABS
2.0000 mg | ORAL_TABLET | Freq: Every day | ORAL | Status: DC
  Administered 2017-07-06 – 2017-07-09 (×4): 2 mg via ORAL
  Filled 2017-07-06 (×2): qty 1
  Filled 2017-07-06: qty 2
  Filled 2017-07-06 (×3): qty 1

## 2017-07-06 NOTE — Progress Notes (Signed)
D: Pt was in the dayroom upon initial approach.  Pt presents with depressed, anxious affect and mood.  She reports she feels "like shit" and she is experiencing "roller coaster emotions."  Pt reports feeling anxious.  Endorses SI without a plan.  Pt denies HI, denies hallucinations, reports back pain of 10/10.  She reports she has been having difficulty sleeping.  Pt has been visible in milieu interacting with peers and staff cautiously.  Pt attended evening group.    A: Introduced self to pt.  Actively listened to pt and offered support and encouragement. Medications administered per order.  PRN medication administered for pain.  On-site provider notified of pt's complaints and PRN medication for anxiety and sleep was ordered and administered.  Heat packs provided for pain.  Q15 minute safety checks maintained.  R: Pt is safe on the unit.  Pt is compliant with medications.  Pt verbally contracts for safety.  Will continue to monitor and assess.

## 2017-07-06 NOTE — BHH Group Notes (Signed)
[place] Group Notes: (Clinical Social Work)   07/06/2017      Type of Therapy:  Group Therapy   Participation Level:  Did Not Attend despite MHT prompting   Selmer Dominion, LCSW 07/06/2017, 12:29 PM
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
## 2017-07-06 NOTE — Plan of Care (Signed)
  Progressing Safety: Periods of time without injury will increase 07/06/2017 0112 - Progressing by Karie Kirks, RN Note Pt has not harmed self or others tonight.  She endorses SI without plan.  Pt verbally contracts for safety.

## 2017-07-06 NOTE — Progress Notes (Signed)
D. Pt has been in her bed sleeping for much of the morning and after lunch. Pt reports having slept well last night, but endorses low energy. Pt currently denies SI/HI and AVH .Per pt's inventory, pt rates her depression and anxiety a 10/10 and hopelessness a 5/10 . Pt endorses (per pt's self inventory) back pain, a 10/10, but has not received prn med because pt sleeping.  A. Labs and vitals monitored. Pt did refuse Latuda this am, but was compliant with other medications. Pt supported emotionally and encouraged to express concerns and ask questions.   R. Pt remains safe with 15 minute checks. Will continue POC.
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
## 2017-07-07 MED ORDER — POTASSIUM CHLORIDE CRYS ER 20 MEQ PO TBCR
40.0000 meq | EXTENDED_RELEASE_TABLET | Freq: Once | ORAL | Status: AC
  Administered 2017-07-07: 40 meq via ORAL
  Filled 2017-07-07 (×2): qty 2

## 2017-07-07 NOTE — Progress Notes (Signed)
D: Pt was in bed in her room upon initial approach.  Pt presents with depressed affect and mood.  Pt denies SI/HI, denies hallucinations.  Pt's goal is to "try to talk to the doctor about getting out of here."  Pt has been isolative to her room for the majority of the evening.   A: Introduced self to pt.  Actively listened to pt and offered support and encouragement. Medication administered per order.  Fall prevention techniques reviewed with pt, pt verbalized understanding.  Q15 minute safety checks maintained.  R: Pt is safe on the unit.  Pt is compliant with medication.  Pt verbally contracts for safety.  Will continue to monitor and assess.

## 2017-07-07 NOTE — Plan of Care (Signed)
  Progressing Activity: Sleeping patterns will improve 07/07/2017 0106 - Progressing by Karie Kirks, RN Note Slept 6.75 hours last night according to flowsheet.

## 2017-07-07 NOTE — BHH Group Notes (Signed)
[facility] Group Notes: (Clinical Social Work)   07/07/2017      Type of Therapy:  Group Therapy   Participation Level:  Did Not Attend despite MHT prompting   Selmer Dominion, LCSW 07/07/2017, 12:25 PM

## 2017-07-07 NOTE — Progress Notes (Signed)
Patient ID: Tanya Diaz, female   DOB: 1976/03/18, 41 y.o.   MRN: [id number]  Pt currently presents with a flat affect and depressed behavior. Pt reports to writer that she "doesn't feel very good." Pt is obviously sweating when approached while asleep. Temperature WDL at 98.4. Pt endorses ongoing back pain. Pt reports good sleep with current medication regimen.   Pt provided with medications per providers orders. Pt's labs and vitals were monitored throughout the night. Pt given a 1:1 about emotional and mental status. Pt supported and encouraged to express concerns and questions. Pt educated on medications. Applied heat to ease back pain.   Pt's safety ensured with 15 minute and environmental checks. Pt currently denies SI/HI and A/V hallucinations. Pt verbally agrees to seek staff if SI/HI or A/VH occurs and to consult with staff before acting on any harmful thoughts. Will continue POC.
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
## 2017-07-07 NOTE — Progress Notes (Signed)
Patient ID: Tanya Diaz, female   DOB: May 21, 1976, 41 y.o.   MRN: [id number]  DAR: Pt. Denies SI/HI and A/V Hallucinations. Patient does report pain in her lower back that is chronic but she is currently refusing any intervention at this time. Support and encouragement provided to the patient however she remains minimal. Scheduled medications administered to patient late today as patient would not get out of bed until late in the morning. She keeps mostly to herself in the milieu and retreats back to her room. Q15 minute checks are maintained for safety.

## 2017-07-07 NOTE — Progress Notes (Signed)
Patient has been resting in bed. Quiet, withdrawn and cautious on approach. Flat in affect with depressed mood. Complains of continued back pain and mild congestion. Verbalizes frustration that these issues aren't being addressed. Reiterated limitations exist in treating her discomfort. Also reminded patient she would receive tenex tonight at hs. Offered cepacol but patient declined as she was on her way to dinner. Patient verbalizes understanding. Remains safe on level III obs.
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
## 2017-07-08 MED ORDER — MIRTAZAPINE 15 MG PO TABS
15.0000 mg | ORAL_TABLET | Freq: Every day | ORAL | Status: DC
  Administered 2017-07-08 – 2017-07-09 (×2): 15 mg via ORAL
  Filled 2017-07-08 (×4): qty 1

## 2017-07-08 MED ORDER — GUAIFENESIN-DM 100-10 MG/5ML PO SYRP
10.0000 mL | ORAL_SOLUTION | ORAL | Status: DC | PRN
  Administered 2017-07-08 – 2017-07-10 (×2): 10 mL via ORAL
  Filled 2017-07-08 (×2): qty 10

## 2017-07-08 NOTE — Progress Notes (Signed)
Adult Psychoeducational Group Note  Date:  07/08/2017 Time:  9:26 PM  Group Topic/Focus:  Wrap-Up Group:   The focus of this group is to help patients review their daily goal of treatment and discuss progress on daily workbooks.  Participation Level:  Did Not Attend  Participation Quality:  Did Not Attend  Affect:  Did Not Attend  Cognitive:  Did Not Attend  Insight: None  Engagement in Group:  Did Not Attend  Modes of Intervention:  Did Not Attend  Additional Comments:  Pt did not attend evening wrap up group tonight.  Candy Sledge 07/08/2017, 9:26 PM
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
## 2017-07-08 NOTE — Progress Notes (Signed)
Recreation Therapy Notes  Date: 07/08/17 Time:  0930 Location: 300 Hall Dayroom  Group Topic: Stress Management  Goal Area(s) Addresses:  Patient will verbalize importance of using healthy stress management.  Patient will identify positive emotions associated with healthy stress management.   Intervention: Stress Management  Activity : Guided Imagery.  LRT introduced the stress management technique of guided imagery.  LRT read a script that allowed patients to envision listening to the waves at the beach.  Patients were to follow along as LRT read script.  Education:  Stress Management, Discharge Planning.   Education Outcome: Acknowledges edcuation/In group clarification offered/Needs additional education  Clinical Observations/Feedback: Pt did not attend group.   Victorino Sparrow, LRT/CTRS         Victorino Sparrow A 07/08/2017 12:15 PM

## 2017-07-08 NOTE — Progress Notes (Signed)
Patient ID: Tanya Diaz, female   DOB: 1975-12-22, 41 y.o.   MRN: [id number]  Pt currently presents with a flat affect and depressed behavior. Pt remains in bed tonight, chooses not to attend group. Pt states "I'm still feeling under the weather." Pt reports good sleep with current medication regimen.   Pt provided with medications per providers orders. Pt's labs and vitals were monitored throughout the night. Pt given a 1:1 about emotional and mental status. Pt supported and encouraged to express concerns and questions. Pt educated on medications.  Pt's safety ensured with 15 minute and environmental checks. Pt currently denies SI/HI and A/V hallucinations. Pt verbally agrees to seek staff if SI/HI or A/VH occurs and to consult with staff before acting on any harmful thoughts. Will continue POC.
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
## 2017-07-08 NOTE — BHH Suicide Risk Assessment (Signed)
[place] INPATIENT:  Family/Significant Other Suicide Prevention Education  Suicide Prevention Education:  Contact Attempts: Tanya Diaz (pt's sister) [phone] has been identified by the patient as the family member/significant other with whom the patient will be residing, and identified as the person(s) who will aid the patient in the event of a mental health crisis.  With written consent from the patient, two attempts were made to provide suicide prevention education, prior to and/or following the patient's discharge.  We were unsuccessful in providing suicide prevention education.  A suicide education pamphlet was given to the patient to share with family/significant other.  Date and time of first attemp: 9:50AM on 07/08/17 (unable to leave voicemail-mailbox full)  Date and time of second attempt: 11:39AM on 07/08/17 (Unable to leave voicemail).   Tanya Diaz N Smart LCSW 07/08/2017, 11:38 AM

## 2017-07-08 NOTE — Progress Notes (Signed)
Patient ID: Tanya Diaz, female   DOB: 07-28-1976, 41 y.o.   MRN: [id number]  DAR: Pt. Denies SI/HI and A/V Hallucinations. She reports that her sleep is good, her appetite is fair, her energy level is low, and her concentration is poor. She rates her depression level 5/10, her hopelessness level 5/10, and anxiety level is 10/10. Patient does not report any pain or discomfort at this time. Support and encouragement provided to the patient however patient is minimal with staff. Scheduled medications administered to patient's Patient is minimal and continues to isolate to her room. She reports that she would like to discharge soon. Her affect is flat and her mood continues to present as depressed. She did smile this morning briefly when writer was interacting with her. Q15 minute checks are maintained for safety.

## 2017-07-08 NOTE — Progress Notes (Signed)
Pt did not attend wrap-up group   

## 2017-07-09 DIAGNOSIS — F419 Anxiety disorder, unspecified: Secondary | ICD-10-CM

## 2017-07-09 DIAGNOSIS — G4700 Insomnia, unspecified: Secondary | ICD-10-CM

## 2017-07-09 DIAGNOSIS — F39 Unspecified mood [affective] disorder: Secondary | ICD-10-CM

## 2017-07-09 NOTE — Plan of Care (Signed)
Pt safe on the unit at this time

## 2017-07-09 NOTE — Progress Notes (Signed)
Recreation Therapy Notes  Animal-Assisted Activity (AAA) Program Checklist/Progress Notes Patient Eligibility Criteria Checklist & Daily Group note for Rec TxIntervention  Date: 11.27.2018 Time: 2:45pm Location: Adult Unit  AAA/T Program Assumption of Risk Form signed by Patient/ or Parent Legal Guardian Yes  Patient is free of allergies or sever asthma Yes  Patient reports no fear of animals Yes  Patient reports no history of cruelty to animals Yes  Patient understands his/her participation is voluntary Yes  Behavioral Response: Did not attend.   Laureen Ochs Vivian Neuwirth, LRT/CTRS        Lane Hacker 07/09/2017 3:32 PM

## 2017-07-09 NOTE — Progress Notes (Signed)
D: Pt denies SI/HI/AVH. Pt is pleasant and cooperative. Pt fowards little information at this time, pt seen on the mileu this evening.   A: Pt was offered support and encouragement. Pt was given scheduled medications. Pt was encourage to attend groups. Q 15 minute checks were done for safety.   R:Pt attends groups and interacts well with peers and staff. Pt is taking medication. Pt has no complaints.Pt receptive to treatment and safety maintained on unit.

## 2017-07-09 NOTE — Progress Notes (Signed)
Patient ID: Tanya Diaz, female   DOB: 01/30/1976, 41 y.o.   MRN: [id number] PER STATE REGULATIONS 482.30  THIS CHART WAS REVIEWED FOR MEDICAL NECESSITY WITH RESPECT TO THE PATIENT'S ADMISSION/ DURATION OF STAY.  NEXT REVIEW DATE: 07/13/2017  Chauncy Lean, RN, BSN CASE MANAGER
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
## 2017-07-10 LAB — LIPID PANEL
Cholesterol: 157 mg/dL (ref 0–200)
HDL: 44 mg/dL (ref >40)
LDL Cholesterol: 79 mg/dL (ref 0–99)
TRIGLYCERIDES: 169 mg/dL — AB (ref <150)
Total CHOL/HDL Ratio: 3.6 RATIO
VLDL: 34 mg/dL (ref 0–40)

## 2017-07-10 LAB — TSH: TSH: 1.328 u[IU]/mL (ref 0.350–4.500)

## 2017-07-10 LAB — CBC WITH DIFFERENTIAL/PLATELET
BASOS ABS: 0 10*3/uL (ref 0.0–0.1)
BASOS PCT: 0 %
Eosinophils Absolute: 0.2 10*3/uL (ref 0.0–0.7)
Eosinophils Relative: 2 %
HCT: 42.8 % (ref 36.0–46.0)
HEMOGLOBIN: 14.4 g/dL (ref 12.0–15.0)
Lymphocytes Relative: 27 %
Lymphs Abs: 3.5 10*3/uL (ref 0.7–4.0)
MCH: 28.2 pg (ref 26.0–34.0)
MCHC: 33.6 g/dL (ref 30.0–36.0)
MCV: 83.9 fL (ref 78.0–100.0)
Monocytes Absolute: 0.9 10*3/uL (ref 0.1–1.0)
Monocytes Relative: 7 %
NEUTROS ABS: 8.3 10*3/uL — AB (ref 1.7–7.7)
NEUTROS PCT: 64 %
Platelets: 461 10*3/uL — ABNORMAL HIGH (ref 150–400)
RBC: 5.1 MIL/uL (ref 3.87–5.11)
RDW: 14.1 % (ref 11.5–15.5)
WBC: 13 10*3/uL — ABNORMAL HIGH (ref 4.0–10.5)

## 2017-07-10 MED ORDER — ARIPIPRAZOLE 20 MG PO TABS: 20.0000 mg | ORAL_TABLET | Freq: Every day | ORAL | 0 refills | Status: DC

## 2017-07-10 MED ORDER — TRAZODONE HCL 50 MG PO TABS: 50.0000 mg | ORAL_TABLET | Freq: Every evening | ORAL | 0 refills | Status: DC | PRN

## 2017-07-10 MED ORDER — HYDROXYZINE HCL 25 MG PO TABS: 25.0000 mg | ORAL_TABLET | Freq: Four times a day (QID) | ORAL | 0 refills | Status: DC | PRN

## 2017-07-10 MED ORDER — AMLODIPINE BESYLATE 2.5 MG PO TABS: 2.5000 mg | ORAL_TABLET | Freq: Every day | ORAL | 0 refills | Status: AC

## 2017-07-10 MED ORDER — MIRTAZAPINE 15 MG PO TABS: 15.0000 mg | ORAL_TABLET | Freq: Every day | ORAL | 0 refills | Status: DC

## 2017-07-10 MED ORDER — ALBUTEROL SULFATE HFA 108 (90 BASE) MCG/ACT IN AERS: 1.0000 | INHALATION_SPRAY | RESPIRATORY_TRACT | 0 refills | Status: AC | PRN

## 2017-07-10 NOTE — Progress Notes (Signed)
  [facility] Adult Case Management Discharge Plan :  Will you be returning to the same living situation after discharge:  Yes,  home At discharge, do you have transportation home?: Yes,  family member Do you have the ability to pay for your medications: Yes,  medicare/[facility] connected  Release of information consent forms completed and submitted to medical records by CSW.  Patient to Follow up at: Follow-up Information    [facility] 07[facility]16/2017.   Why:  Primary Care Appointment with Dr. Marjo Bicker on this date at 11:30AM. Thank you.  Contact information: [place] [address] [phone]        [facility] on 07/10/2017.   Why:  Hospital follow-up/mental health appt on Wednesday at 2:00PM AND 3:00PM for medication management. Thank you.  Contact information: [address] [place], [place] [address] Phone: [phone] ext: [phone] Fax: [phone]          Next level of care provider has access to [place] and Suicide Prevention discussed: Yes,  SPE completed with pt; contact attempts made with pt's sister. SPI pamphlet and Mobile Crisis information provided to pt.   Have you used any form of tobacco in the last 30 days? (Cigarettes, Smokeless Tobacco, Cigars, and/or Pipes): Yes  Has patient been referred to the Quitline?: Patient refused referral  Patient has been referred for addiction treatment: Yes  [employer], LCSW 07/10/2017, 10:16 AM
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
## 2017-07-10 NOTE — Progress Notes (Signed)
Discharge note:  Patient discharged home per MD order.  Patient received all personal belongings from unit and locker.  Patient denies any depressive sym[ptoms or thoughts of self harm.  She is complaining of congestion and a bad cold.  She is future oriented and has an appointment at [facility] outpatient.  Reviewed AVS/transition record with patient and she indicated understanding.  Patient left via cab and was ambulatory.

## 2017-07-10 NOTE — Progress Notes (Signed)
Recreation Therapy Notes  Date: 07/10/17 Time: 0930 Location: 300 Hall Dayroom  Group Topic: Stress Management  Goal Area(s) Addresses:  Patient will verbalize importance of using healthy stress management.  Patient will identify positive emotions associated with healthy stress management.   Intervention: Stress Management  Activity :  [employer].  LRT introduced the stress management technique of meditation.  Patients were to listen and follow along as LRT played meditation from the Calm app.  Education:  Stress Management, Discharge Planning.   Education Outcome: Acknowledges edcuation/In group clarification offered/Needs additional education  Clinical Observations/Feedback: Pt did not attend group.    Victorino Sparrow, LRT/CTRS         Victorino Sparrow A 07/10/2017 1:03 PM
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
## 2017-07-10 NOTE — Progress Notes (Signed)
Per Ria Comment AC/Patrice RN note, [facility] (Dr. Marjo Bicker) office requested H&P and recent progress note for patient prior to her appt today. CSW faxed to: [phone].  Maxie Better, MSW, LCSW Clinical Social Worker 07/10/2017 10:30 AM

## 2017-07-10 NOTE — Plan of Care (Signed)
  Activity: Interest or engagement in activities will improve 07/10/2017 1018 - Adequate for Discharge by Joice Lofts, RN   Activity: Sleeping patterns will improve 07/10/2017 1018 - Adequate for Discharge by Joice Lofts, RN   Education: Emotional status will improve 07/10/2017 1018 - Adequate for Discharge by Joice Lofts, RN   Education: Mental status will improve 07/10/2017 1018 - Adequate for Discharge by Joice Lofts, RN   Education: Verbalization of understanding the information provided will improve 07/10/2017 1018 - Adequate for Discharge by Joice Lofts, RN   Coping: Ability to verbalize frustrations and anger appropriately will improve 07/10/2017 1018 - Adequate for Discharge by Joice Lofts, RN   Coping: Ability to demonstrate self-control will improve 07/10/2017 1018 - Adequate for Discharge by Joice Lofts, RN   Coping: Ability to cope will improve 07/10/2017 1018 - Adequate for Discharge by Joice Lofts, RN   Self-Concept: Ability to verbalize positive feelings about self will improve 07/10/2017 1018 - Adequate for Discharge by Joice Lofts, RN   Health Behavior/Discharge Planning: Ability to make decisions will improve 07/10/2017 1018 - Adequate for Discharge by Joice Lofts, RN   Role Relationship: Ability to demonstrate positive changes in social behaviors and relationships will improve 07/10/2017 1018 - Adequate for Discharge by Joice Lofts, RN   Self-Concept: Ability to verbalize positive feelings about self will improve 07/10/2017 1018 - Adequate for Discharge by Joice Lofts, RN   Self-Concept: Level of anxiety will decrease 07/10/2017 1018 - Adequate for Discharge by Joice Lofts, RN   Completed/Met Education: Knowledge of [place] Education information/materials will improve 07/10/2017 1018 - Completed/Met by Joice Lofts,  RN Health Behavior/Discharge Planning: Identification of resources available to assist in meeting health care needs will improve 07/10/2017 1018 - Completed/Met by Joice Lofts, RN Compliance with treatment plan for underlying cause of condition will improve 07/10/2017 1018 - Completed/Met by Joice Lofts, RN Physical Regulation: Ability to maintain clinical measurements within normal limits will improve 07/10/2017 1018 - Completed/Met by Joice Lofts, RN Safety: Periods of time without injury will increase 07/10/2017 1018 - Completed/Met by Joice Lofts, RN Education: Ability to make informed decisions regarding treatment will improve 07/10/2017 1018 - Completed/Met by Joice Lofts, RN Medication: Compliance with prescribed medication regimen will improve 07/10/2017 1018 - Completed/Met by Joice Lofts, RN Activity: Interest or engagement in leisure activities will improve 07/10/2017 1018 - Completed/Met by Joice Lofts, RN Imbalance in normal sleep/wake cycle will improve 07/10/2017 1018 - Completed/Met by Joice Lofts, RN Education: Knowledge of the prescribed therapeutic regimen will improve 07/10/2017 1018 - Completed/Met by Joice Lofts, [place] Behavior/Discharge Planning: Compliance with therapeutic regimen will improve 07/10/2017 1018 - Completed/Met by Joice Lofts, [place] to function at adequate level 07/10/2017 1018 - Completed/Met by Joice Lofts, [place]: Identification of resources available to assist in meeting health care needs will improve 07/10/2017 1018 - Completed/Met by Joice Lofts, RN

## 2017-07-11 LAB — HEMOGLOBIN A1C
HEMOGLOBIN A1C: 6.1 % — AB (ref 4.8–5.6)
Mean Plasma Glucose: 128 mg/dL
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
## 2017-07-27 ENCOUNTER — Encounter (HOSPITAL_COMMUNITY): Payer: Self-pay

## 2017-07-27 ENCOUNTER — Emergency Department (HOSPITAL_COMMUNITY)
Admission: EM | Admit: 2017-07-27 | Discharge: 2017-07-27 | Disposition: A | Discharge status: Home / Self Care | Payer: Medicare Other | Attending: Emergency Medicine | Admitting: Emergency Medicine

## 2017-07-27 DIAGNOSIS — F17200 Nicotine dependence, unspecified, uncomplicated: Secondary | ICD-10-CM | POA: Insufficient documentation

## 2017-07-27 DIAGNOSIS — Z91040 Latex allergy status: Secondary | ICD-10-CM | POA: Diagnosis not present

## 2017-07-27 DIAGNOSIS — I10 Essential (primary) hypertension: Secondary | ICD-10-CM | POA: Insufficient documentation

## 2017-07-27 DIAGNOSIS — Z85038 Personal history of other malignant neoplasm of large intestine: Secondary | ICD-10-CM | POA: Insufficient documentation

## 2017-07-27 DIAGNOSIS — H9202 Otalgia, left ear: Secondary | ICD-10-CM | POA: Diagnosis not present

## 2017-07-27 DIAGNOSIS — R002 Palpitations: Secondary | ICD-10-CM | POA: Insufficient documentation

## 2017-07-27 LAB — CBC WITH DIFFERENTIAL/PLATELET
BASOS ABS: 0 10*3/uL (ref 0.0–0.1)
BASOS PCT: 0 %
EOS ABS: 0.3 10*3/uL (ref 0.0–0.7)
EOS PCT: 3 %
HCT: 40.7 % (ref 36.0–46.0)
Hemoglobin: 13.4 g/dL (ref 12.0–15.0)
Lymphocytes Relative: 29 %
Lymphs Abs: 2.8 10*3/uL (ref 0.7–4.0)
MCH: 27.7 pg (ref 26.0–34.0)
MCHC: 32.9 g/dL (ref 30.0–36.0)
MCV: 84.1 fL (ref 78.0–100.0)
Monocytes Absolute: 0.8 10*3/uL (ref 0.1–1.0)
Monocytes Relative: 8 %
Neutro Abs: 5.7 10*3/uL (ref 1.7–7.7)
Neutrophils Relative %: 60 %
PLATELETS: 405 10*3/uL — AB (ref 150–400)
RBC: 4.84 MIL/uL (ref 3.87–5.11)
RDW: 14.7 % (ref 11.5–15.5)
WBC: 9.6 10*3/uL (ref 4.0–10.5)

## 2017-07-27 LAB — TSH: TSH: 0.558 u[IU]/mL (ref 0.350–4.500)

## 2017-07-27 LAB — BASIC METABOLIC PANEL
ANION GAP: 9 (ref 5–15)
BUN: 14 mg/dL (ref 6–20)
CALCIUM: 9.1 mg/dL (ref 8.9–10.3)
CO2: 25 mmol/L (ref 22–32)
Chloride: 103 mmol/L (ref 101–111)
Creatinine, Ser: 0.73 mg/dL (ref 0.44–1.00)
GFR calc Af Amer: >60 mL/min (ref >60)
GLUCOSE: 141 mg/dL — AB (ref 65–99)
Potassium: 3.3 mmol/L — ABNORMAL LOW (ref 3.5–5.1)
SODIUM: 137 mmol/L (ref 135–145)

## 2017-07-27 MED ORDER — KETOROLAC TROMETHAMINE 15 MG/ML IJ SOLN
15.0000 mg | Freq: Once | INTRAMUSCULAR | Status: AC
  Administered 2017-07-27: 15 mg via INTRAVENOUS
  Filled 2017-07-27: qty 1

## 2017-07-27 MED ORDER — AZITHROMYCIN 250 MG PO TABS: 250.0000 mg | ORAL_TABLET | Freq: Every day | ORAL | 0 refills | Status: DC

## 2017-07-27 NOTE — ED Notes (Signed)
IV removed from left hand
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
## 2017-07-27 NOTE — ED Notes (Signed)
Bed: MH68 Expected date:  Expected time:  Means of arrival:  Comments: EMS headache and earache

## 2017-07-27 NOTE — ED Triage Notes (Signed)
Patient arrives by EMS with complaints of left headache and left ear ache that started Friday night and worsened tonight. Patient took tylenol at midnight with no relief. EMS reports patient was pale and diaphoretic when they responded. 12 lead WNL-patient reported her "heart was racing". EMS reports HR 100 at the time of complaint. BP 150/90, HR 100 RR 18 Temp 97.9 CBG 253-pt reports no history of diabetes.

## 2017-07-27 NOTE — Discharge Instructions (Signed)
You were seen in the emergency department today for left ear pain and palpitations.   Left ear pain-at this time your left ear is not appear to have infection.  If your symptoms do not improve in the next 2-3 days I have provided you with a prescription for an antibiotic. Please wait the 2-3 days to see if your condition improves without the antibiotic. If you take the antibiotic be aware this is a new medication with potential side effects. You may develop abdominal discomfort or diarrhea from the antibiotic.  You may help offset this with probiotics which you can buy at the store (ask your pharmacist if unable to find) or get probiotics in the form of eating yogurt. Do not eat or take the probiotics until 2 hours after your antibiotic. If you are unable to tolerate these side effects follow-up with your primary care provider or return to the emergency department.   If you begin to experience any blistering, rashes, swelling, or difficulty breathing seek medical care for evaluation of potentially more serious side effects.   Please be aware that this medication may interact with other medications you are taking, please be sure to discuss your medication list with your pharmacist.   Palpitations- Your EKG and heart monitoring in the emergency department did not show any abnormal heart beats or rhythms. We checked lab work which did not show signs of infection or anemia. Your potassium was a bit low at 3.3- be sure to include foods high in potassium in your diet. Otherwise your electrolytes were normal.  We also checked your thyroid function, this test is not back yet we will call you if it is abnormal.  You need to follow-up with your primary care provider within the next 1 week for further evaluation of your palpitations.   Please be sure to follow-up with your primary care provider in 1 week in regards to your ear pain and your palpitations.  This follow-up is to ensure improvement and to allow for  possible further evaluation if necessary.  Return to the emergency department at any time for any new, worsening, or concerning symptoms including but not limited to inability to move your neck, pain behind your left ear, chest pain, difficulty breathing, or passing out.

## 2017-10-30 ENCOUNTER — Other Ambulatory Visit: Payer: Self-pay

## 2017-10-30 ENCOUNTER — Encounter (HOSPITAL_COMMUNITY): Payer: Self-pay | Admitting: *Deleted

## 2017-10-30 ENCOUNTER — Emergency Department (HOSPITAL_COMMUNITY)
Admission: EM | Admit: 2017-10-30 | Discharge: 2017-10-31 | Disposition: A | Discharge status: Home / Self Care | Payer: Medicare Other | Attending: Emergency Medicine | Admitting: Emergency Medicine

## 2017-10-30 ENCOUNTER — Emergency Department (HOSPITAL_COMMUNITY): Payer: Medicare Other

## 2017-10-30 DIAGNOSIS — M791 Myalgia, unspecified site: Secondary | ICD-10-CM | POA: Insufficient documentation

## 2017-10-30 DIAGNOSIS — Z5321 Procedure and treatment not carried out due to patient leaving prior to being seen by health care provider: Secondary | ICD-10-CM | POA: Insufficient documentation

## 2017-10-30 LAB — RAPID STREP SCREEN (MED CTR MEBANE ONLY): Streptococcus, Group A Screen (Direct): NEGATIVE

## 2017-10-30 NOTE — ED Triage Notes (Signed)
Cold cough congestion productive cough hoarse sorethroat   Body aches for 2-3 molonths intermittently.  Cold  sorehtroat for 3-4 days no temp  lmp feb 17th

## 2017-10-31 NOTE — ED Notes (Signed)
Pts name called for a room no answer 

## 2017-11-01 NOTE — ED Notes (Signed)
11/01/2017 attempted follow-up call.  No answer.

## 2017-11-02 LAB — CULTURE, GROUP A STREP (THRC)

## 2017-11-07 ENCOUNTER — Emergency Department (HOSPITAL_COMMUNITY): Payer: Medicare Other

## 2017-11-07 ENCOUNTER — Emergency Department (HOSPITAL_COMMUNITY)
Admission: EM | Admit: 2017-11-07 | Discharge: 2017-11-07 | Disposition: A | Discharge status: Home / Self Care | Payer: Medicare Other | Attending: Emergency Medicine | Admitting: Emergency Medicine

## 2017-11-07 ENCOUNTER — Encounter (HOSPITAL_COMMUNITY): Payer: Self-pay | Admitting: Emergency Medicine

## 2017-11-07 DIAGNOSIS — R509 Fever, unspecified: Secondary | ICD-10-CM | POA: Diagnosis not present

## 2017-11-07 DIAGNOSIS — Z91040 Latex allergy status: Secondary | ICD-10-CM | POA: Diagnosis not present

## 2017-11-07 DIAGNOSIS — M546 Pain in thoracic spine: Secondary | ICD-10-CM | POA: Insufficient documentation

## 2017-11-07 DIAGNOSIS — I10 Essential (primary) hypertension: Secondary | ICD-10-CM | POA: Diagnosis not present

## 2017-11-07 DIAGNOSIS — R042 Hemoptysis: Secondary | ICD-10-CM | POA: Insufficient documentation

## 2017-11-07 DIAGNOSIS — Z85038 Personal history of other malignant neoplasm of large intestine: Secondary | ICD-10-CM | POA: Diagnosis not present

## 2017-11-07 DIAGNOSIS — R0789 Other chest pain: Secondary | ICD-10-CM | POA: Insufficient documentation

## 2017-11-07 DIAGNOSIS — Z79899 Other long term (current) drug therapy: Secondary | ICD-10-CM | POA: Diagnosis not present

## 2017-11-07 DIAGNOSIS — M6281 Muscle weakness (generalized): Secondary | ICD-10-CM | POA: Diagnosis not present

## 2017-11-07 DIAGNOSIS — F17210 Nicotine dependence, cigarettes, uncomplicated: Secondary | ICD-10-CM | POA: Diagnosis not present

## 2017-11-07 MED ORDER — AZITHROMYCIN 250 MG PO TABS: 250.0000 mg | ORAL_TABLET | Freq: Every day | ORAL | 0 refills | Status: DC

## 2017-11-07 MED ORDER — DEXAMETHASONE 2 MG PO TABS
10.0000 mg | ORAL_TABLET | Freq: Once | ORAL | Status: AC
  Administered 2017-11-07: 23:00:00 10 mg via ORAL
  Filled 2017-11-07: qty 2

## 2017-11-07 NOTE — ED Triage Notes (Signed)
Per EMS ,pt. From home with complaint of coughing out blood x 2 days, pt. Reported of having productive cough over a months now, denied fever. Denied SOB. Pt. Also complaint of  generalized weakness over a week now. Has hx of  Asthma and HTN.

## 2017-11-07 NOTE — Discharge Instructions (Signed)
Follow-up with your PCP.
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
## 2017-11-07 NOTE — ED Notes (Addendum)
Patient states she has been coughing blood, has had dizziness and neck pain for 2 days. Patient also complains of chest tightness for about a month with a cough.

## 2018-01-02 ENCOUNTER — Encounter (HOSPITAL_COMMUNITY): Payer: Self-pay | Admitting: Emergency Medicine

## 2018-01-02 ENCOUNTER — Emergency Department (HOSPITAL_COMMUNITY): Payer: Medicare Other

## 2018-01-02 ENCOUNTER — Emergency Department (HOSPITAL_COMMUNITY)
Admission: EM | Admit: 2018-01-02 | Discharge: 2018-01-02 | Disposition: A | Discharge status: Home / Self Care | Payer: Medicare Other | Attending: Emergency Medicine | Admitting: Emergency Medicine

## 2018-01-02 ENCOUNTER — Other Ambulatory Visit: Payer: Self-pay

## 2018-01-02 DIAGNOSIS — Z85038 Personal history of other malignant neoplasm of large intestine: Secondary | ICD-10-CM | POA: Diagnosis not present

## 2018-01-02 DIAGNOSIS — F17200 Nicotine dependence, unspecified, uncomplicated: Secondary | ICD-10-CM | POA: Diagnosis not present

## 2018-01-02 DIAGNOSIS — Z91040 Latex allergy status: Secondary | ICD-10-CM | POA: Insufficient documentation

## 2018-01-02 DIAGNOSIS — Z79899 Other long term (current) drug therapy: Secondary | ICD-10-CM | POA: Diagnosis not present

## 2018-01-02 DIAGNOSIS — J45909 Unspecified asthma, uncomplicated: Secondary | ICD-10-CM | POA: Diagnosis not present

## 2018-01-02 DIAGNOSIS — R1032 Left lower quadrant pain: Secondary | ICD-10-CM | POA: Insufficient documentation

## 2018-01-02 DIAGNOSIS — I10 Essential (primary) hypertension: Secondary | ICD-10-CM | POA: Diagnosis not present

## 2018-01-02 HISTORY — DX: Unspecified asthma, uncomplicated: J45.909

## 2018-01-02 LAB — COMPREHENSIVE METABOLIC PANEL
ALBUMIN: 4.3 g/dL (ref 3.5–5.0)
ALK PHOS: 91 U/L (ref 38–126)
ALT: 30 U/L (ref 14–54)
AST: 22 U/L (ref 15–41)
Anion gap: 12 (ref 5–15)
BUN: 9 mg/dL (ref 6–20)
CALCIUM: 9.2 mg/dL (ref 8.9–10.3)
CHLORIDE: 108 mmol/L (ref 101–111)
CO2: 18 mmol/L — ABNORMAL LOW (ref 22–32)
CREATININE: 0.72 mg/dL (ref 0.44–1.00)
GFR calc Af Amer: >60 mL/min (ref >60)
GFR calc non Af Amer: >60 mL/min (ref >60)
GLUCOSE: 100 mg/dL — AB (ref 65–99)
Potassium: 3.9 mmol/L (ref 3.5–5.1)
Sodium: 138 mmol/L (ref 135–145)
Total Bilirubin: 0.1 mg/dL — ABNORMAL LOW (ref 0.3–1.2)
Total Protein: 8.4 g/dL — ABNORMAL HIGH (ref 6.5–8.1)

## 2018-01-02 LAB — URINALYSIS, ROUTINE W REFLEX MICROSCOPIC
Bilirubin Urine: NEGATIVE
GLUCOSE, UA: NEGATIVE mg/dL
Ketones, ur: NEGATIVE mg/dL
Leukocytes, UA: NEGATIVE
Nitrite: NEGATIVE
PROTEIN: NEGATIVE mg/dL
Specific Gravity, Urine: 1.019 (ref 1.005–1.030)
pH: 5 (ref 5.0–8.0)

## 2018-01-02 LAB — CBC
HCT: 44.8 % (ref 36.0–46.0)
HEMOGLOBIN: 15.2 g/dL — AB (ref 12.0–15.0)
MCH: 27.4 pg (ref 26.0–34.0)
MCHC: 33.9 g/dL (ref 30.0–36.0)
MCV: 80.7 fL (ref 78.0–100.0)
PLATELETS: 436 10*3/uL — AB (ref 150–400)
RBC: 5.55 MIL/uL — ABNORMAL HIGH (ref 3.87–5.11)
RDW: 14.7 % (ref 11.5–15.5)
WBC: 14.9 10*3/uL — ABNORMAL HIGH (ref 4.0–10.5)

## 2018-01-02 LAB — I-STAT BETA HCG BLOOD, ED (MC, WL, AP ONLY): I-stat hCG, quantitative: <5 m[IU]/mL (ref <5)

## 2018-01-02 LAB — WET PREP, GENITAL
Clue Cells Wet Prep HPF POC: NONE SEEN
SPERM: NONE SEEN
TRICH WET PREP: NONE SEEN
YEAST WET PREP: NONE SEEN

## 2018-01-02 LAB — LIPASE, BLOOD: Lipase: 21 U/L (ref 11–51)

## 2018-01-02 MED ORDER — ONDANSETRON HCL 4 MG/2ML IJ SOLN
4.0000 mg | Freq: Once | INTRAMUSCULAR | Status: AC
  Administered 2018-01-02: 4 mg via INTRAVENOUS
  Filled 2018-01-02: qty 2

## 2018-01-02 MED ORDER — IOPAMIDOL (ISOVUE-300) INJECTION 61%
100.0000 mL | Freq: Once | INTRAVENOUS | Status: AC | PRN
  Administered 2018-01-02: 100 mL via INTRAVENOUS

## 2018-01-02 MED ORDER — IOPAMIDOL (ISOVUE-300) INJECTION 61%
INTRAVENOUS | Status: AC
  Filled 2018-01-02: qty 100

## 2018-01-02 MED ORDER — TRAMADOL HCL 50 MG PO TABS: 50.0000 mg | ORAL_TABLET | Freq: Four times a day (QID) | ORAL | 0 refills | Status: DC | PRN

## 2018-01-02 MED ORDER — MORPHINE SULFATE (PF) 4 MG/ML IV SOLN
4.0000 mg | Freq: Once | INTRAVENOUS | Status: AC
  Administered 2018-01-02: 4 mg via INTRAVENOUS
  Filled 2018-01-02: qty 1

## 2018-01-02 MED ORDER — SODIUM CHLORIDE 0.9 % IV BOLUS
500.0000 mL | Freq: Once | INTRAVENOUS | Status: AC
  Administered 2018-01-02: 500 mL via INTRAVENOUS

## 2018-01-02 NOTE — ED Triage Notes (Signed)
Pt c/o LLQ pain for past month that was intermittent but last couple days been constant. Repots n/v/d for several days as well.

## 2018-01-02 NOTE — Discharge Instructions (Signed)
You were seen in the emergency department today for pain in the left lower part of your abdomen.  The CT scan and labs performed o did not show any problems with the organs in your abdomen or pelvis.  The ultrasound that we did did show an ovarian cyst, it is possible this is contributing to your discomfort, the cyst was not ruptured and did not appear infected.  We are sending you home with a prescription for tramadol- take this every 6 hours as needed for pain- do not drive or operate heavy machinery when taking this medication. We have prescribed you new medication(s) today. Discuss the medications prescribed today with your pharmacist as they can have adverse effects and interactions with your other medicines including over the counter and prescribed medications. Seek medical evaluation if you start to experience new or abnormal symptoms after taking one of these medicines, seek care immediately if you start to experience difficulty breathing, feeling of your throat closing, facial swelling, or rash as these could be indications of a more serious allergic reaction   Follow-up with your primary care provider or the women's health clinic provided in your discharge instructions in the next 3 days.  Return to the ER for new or worsening symptoms or any other concerns that you may have.

## 2018-01-02 NOTE — ED Notes (Signed)
Patient transported to CT 
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
## 2018-01-02 NOTE — ED Notes (Signed)
ED Provider at bedside. 

## 2018-01-03 LAB — GC/CHLAMYDIA PROBE AMP (~~LOC~~) NOT AT ARMC
Chlamydia: NEGATIVE
Neisseria Gonorrhea: NEGATIVE

## 2018-03-03 ENCOUNTER — Encounter (HOSPITAL_COMMUNITY): Payer: Self-pay | Admitting: *Deleted

## 2018-03-03 ENCOUNTER — Emergency Department (HOSPITAL_COMMUNITY)
Admission: EM | Admit: 2018-03-03 | Discharge: 2018-03-03 | Disposition: A | Discharge status: Home / Self Care | Payer: Medicare Other | Attending: Emergency Medicine | Admitting: Emergency Medicine

## 2018-03-03 ENCOUNTER — Other Ambulatory Visit: Payer: Self-pay

## 2018-03-03 DIAGNOSIS — I10 Essential (primary) hypertension: Secondary | ICD-10-CM | POA: Diagnosis not present

## 2018-03-03 DIAGNOSIS — R102 Pelvic and perineal pain: Secondary | ICD-10-CM | POA: Diagnosis not present

## 2018-03-03 DIAGNOSIS — J45909 Unspecified asthma, uncomplicated: Secondary | ICD-10-CM | POA: Insufficient documentation

## 2018-03-03 DIAGNOSIS — Z79899 Other long term (current) drug therapy: Secondary | ICD-10-CM | POA: Insufficient documentation

## 2018-03-03 DIAGNOSIS — F17200 Nicotine dependence, unspecified, uncomplicated: Secondary | ICD-10-CM | POA: Diagnosis not present

## 2018-03-03 DIAGNOSIS — N946 Dysmenorrhea, unspecified: Secondary | ICD-10-CM

## 2018-03-03 LAB — WET PREP, GENITAL
CLUE CELLS WET PREP: NONE SEEN
Sperm: NONE SEEN
TRICH WET PREP: NONE SEEN
YEAST WET PREP: NONE SEEN

## 2018-03-03 LAB — COMPREHENSIVE METABOLIC PANEL
ALK PHOS: 100 U/L (ref 38–126)
ALT: 41 U/L (ref 0–44)
ANION GAP: 8 (ref 5–15)
AST: 24 U/L (ref 15–41)
Albumin: 4.2 g/dL (ref 3.5–5.0)
BUN: 15 mg/dL (ref 6–20)
CO2: 25 mmol/L (ref 22–32)
CREATININE: 0.67 mg/dL (ref 0.44–1.00)
Calcium: 9.3 mg/dL (ref 8.9–10.3)
Chloride: 106 mmol/L (ref 98–111)
Glucose, Bld: 121 mg/dL — ABNORMAL HIGH (ref 70–99)
Potassium: 3.9 mmol/L (ref 3.5–5.1)
SODIUM: 139 mmol/L (ref 135–145)
Total Bilirubin: 0.4 mg/dL (ref 0.3–1.2)
Total Protein: 8 g/dL (ref 6.5–8.1)

## 2018-03-03 LAB — CBC WITH DIFFERENTIAL/PLATELET
Basophils Absolute: 0 10*3/uL (ref 0.0–0.1)
Basophils Relative: 0 %
EOS ABS: 0.3 10*3/uL (ref 0.0–0.7)
EOS PCT: 3 %
HCT: 45.2 % (ref 36.0–46.0)
Hemoglobin: 15 g/dL (ref 12.0–15.0)
LYMPHS ABS: 3.5 10*3/uL (ref 0.7–4.0)
LYMPHS PCT: 27 %
MCH: 27.7 pg (ref 26.0–34.0)
MCHC: 33.2 g/dL (ref 30.0–36.0)
MCV: 83.4 fL (ref 78.0–100.0)
MONOS PCT: 8 %
Monocytes Absolute: 1.1 10*3/uL — ABNORMAL HIGH (ref 0.1–1.0)
Neutro Abs: 8.3 10*3/uL — ABNORMAL HIGH (ref 1.7–7.7)
Neutrophils Relative %: 62 %
Platelets: 492 10*3/uL — ABNORMAL HIGH (ref 150–400)
RBC: 5.42 MIL/uL — ABNORMAL HIGH (ref 3.87–5.11)
RDW: 15.2 % (ref 11.5–15.5)
WBC: 13.3 10*3/uL — ABNORMAL HIGH (ref 4.0–10.5)

## 2018-03-03 LAB — I-STAT BETA HCG BLOOD, ED (MC, WL, AP ONLY): I-stat hCG, quantitative: <5 m[IU]/mL (ref <5)

## 2018-03-03 MED ORDER — HYDROCODONE-ACETAMINOPHEN 5-325 MG PO TABS
1.0000 | ORAL_TABLET | Freq: Once | ORAL | Status: AC
  Administered 2018-03-03: 1 via ORAL
  Filled 2018-03-03: qty 1
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
## 2018-03-03 NOTE — Discharge Instructions (Addendum)
Take tylenol as needed for pain. Follow up with your doctor next week as scheduled. If the bleeding get heavier or you have increased pain go to [facility] for further evaluation. Your blood pressure today is elevated. Be sure you are taking your medication as directed and discuss this with your doctor

## 2018-03-03 NOTE — ED Triage Notes (Signed)
Several days of lower abd ? Ovarian pain with heavy bleeding x 2 days. Continues to be heavy enough for her to feel the need to wear a pamper.

## 2018-03-04 LAB — GC/CHLAMYDIA PROBE AMP (~~LOC~~) NOT AT ARMC
Chlamydia: NEGATIVE
NEISSERIA GONORRHEA: NEGATIVE

## 2018-03-25 ENCOUNTER — Encounter (HOSPITAL_COMMUNITY): Payer: Self-pay

## 2018-03-25 ENCOUNTER — Emergency Department (HOSPITAL_COMMUNITY)
Admission: EM | Admit: 2018-03-25 | Discharge: 2018-03-26 | Disposition: A | Discharge status: Home / Self Care | Payer: Medicare Other | Attending: Emergency Medicine | Admitting: Emergency Medicine

## 2018-03-25 ENCOUNTER — Other Ambulatory Visit: Payer: Self-pay

## 2018-03-25 DIAGNOSIS — F319 Bipolar disorder, unspecified: Secondary | ICD-10-CM | POA: Diagnosis not present

## 2018-03-25 DIAGNOSIS — R1031 Right lower quadrant pain: Secondary | ICD-10-CM | POA: Diagnosis not present

## 2018-03-25 DIAGNOSIS — Z91040 Latex allergy status: Secondary | ICD-10-CM | POA: Diagnosis not present

## 2018-03-25 DIAGNOSIS — I10 Essential (primary) hypertension: Secondary | ICD-10-CM | POA: Insufficient documentation

## 2018-03-25 DIAGNOSIS — R1030 Lower abdominal pain, unspecified: Secondary | ICD-10-CM

## 2018-03-25 DIAGNOSIS — Z85038 Personal history of other malignant neoplasm of large intestine: Secondary | ICD-10-CM | POA: Insufficient documentation

## 2018-03-25 DIAGNOSIS — Z79899 Other long term (current) drug therapy: Secondary | ICD-10-CM | POA: Insufficient documentation

## 2018-03-25 DIAGNOSIS — F17210 Nicotine dependence, cigarettes, uncomplicated: Secondary | ICD-10-CM | POA: Diagnosis not present

## 2018-03-25 DIAGNOSIS — R1032 Left lower quadrant pain: Secondary | ICD-10-CM | POA: Diagnosis present

## 2018-03-25 LAB — COMPREHENSIVE METABOLIC PANEL
ALK PHOS: 90 U/L (ref 38–126)
ALT: 28 U/L (ref 0–44)
AST: 30 U/L (ref 15–41)
Albumin: 4.2 g/dL (ref 3.5–5.0)
Anion gap: 11 (ref 5–15)
BUN: 13 mg/dL (ref 6–20)
CALCIUM: 9.3 mg/dL (ref 8.9–10.3)
CHLORIDE: 107 mmol/L (ref 98–111)
CO2: 22 mmol/L (ref 22–32)
CREATININE: 0.85 mg/dL (ref 0.44–1.00)
Glucose, Bld: 136 mg/dL — ABNORMAL HIGH (ref 70–99)
Potassium: 3.7 mmol/L (ref 3.5–5.1)
Sodium: 140 mmol/L (ref 135–145)
Total Bilirubin: 0.4 mg/dL (ref 0.3–1.2)
Total Protein: 8.1 g/dL (ref 6.5–8.1)

## 2018-03-25 LAB — URINALYSIS, ROUTINE W REFLEX MICROSCOPIC
Bilirubin Urine: NEGATIVE
Glucose, UA: NEGATIVE mg/dL
Hgb urine dipstick: NEGATIVE
Ketones, ur: NEGATIVE mg/dL
LEUKOCYTES UA: NEGATIVE
NITRITE: NEGATIVE
PROTEIN: NEGATIVE mg/dL
Specific Gravity, Urine: 1.03 (ref 1.005–1.030)
pH: 5 (ref 5.0–8.0)

## 2018-03-25 LAB — LIPASE, BLOOD: LIPASE: 25 U/L (ref 11–51)

## 2018-03-25 LAB — CBC
HCT: 43.5 % (ref 36.0–46.0)
Hemoglobin: 14.8 g/dL (ref 12.0–15.0)
MCH: 28.1 pg (ref 26.0–34.0)
MCHC: 34 g/dL (ref 30.0–36.0)
MCV: 82.5 fL (ref 78.0–100.0)
PLATELETS: 465 10*3/uL — AB (ref 150–400)
RBC: 5.27 MIL/uL — AB (ref 3.87–5.11)
RDW: 15.1 % (ref 11.5–15.5)
WBC: 15.1 10*3/uL — AB (ref 4.0–10.5)

## 2018-03-25 NOTE — ED Triage Notes (Signed)
Pt c/o left side abd pain, back pain + nausea. Pt has been dx with cysts. Was scheduled to go to a follow up appointment at [facility] but missed it. Same sx.

## 2018-03-26 ENCOUNTER — Encounter (HOSPITAL_COMMUNITY): Payer: Self-pay | Admitting: Emergency Medicine

## 2018-03-26 ENCOUNTER — Emergency Department (HOSPITAL_COMMUNITY): Payer: Medicare Other

## 2018-03-26 LAB — PREGNANCY, URINE: PREG TEST UR: NEGATIVE

## 2018-03-26 MED ORDER — DICYCLOMINE HCL 20 MG PO TABS: 20.0000 mg | ORAL_TABLET | Freq: Two times a day (BID) | ORAL | 0 refills | Status: AC
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
## 2018-05-03 ENCOUNTER — Emergency Department (HOSPITAL_COMMUNITY)
Admission: EM | Admit: 2018-05-03 | Discharge: 2018-05-03 | Disposition: A | Discharge status: Home / Self Care | Payer: Medicare Other | Attending: Emergency Medicine | Admitting: Emergency Medicine

## 2018-05-03 ENCOUNTER — Emergency Department (HOSPITAL_COMMUNITY): Payer: Medicare Other

## 2018-05-03 ENCOUNTER — Encounter (HOSPITAL_COMMUNITY): Payer: Self-pay | Admitting: *Deleted

## 2018-05-03 DIAGNOSIS — R1030 Lower abdominal pain, unspecified: Secondary | ICD-10-CM | POA: Insufficient documentation

## 2018-05-03 DIAGNOSIS — F17200 Nicotine dependence, unspecified, uncomplicated: Secondary | ICD-10-CM | POA: Insufficient documentation

## 2018-05-03 DIAGNOSIS — Z79899 Other long term (current) drug therapy: Secondary | ICD-10-CM | POA: Insufficient documentation

## 2018-05-03 DIAGNOSIS — Z85038 Personal history of other malignant neoplasm of large intestine: Secondary | ICD-10-CM | POA: Insufficient documentation

## 2018-05-03 DIAGNOSIS — Z91040 Latex allergy status: Secondary | ICD-10-CM | POA: Diagnosis not present

## 2018-05-03 DIAGNOSIS — J45909 Unspecified asthma, uncomplicated: Secondary | ICD-10-CM | POA: Diagnosis not present

## 2018-05-03 DIAGNOSIS — I10 Essential (primary) hypertension: Secondary | ICD-10-CM | POA: Diagnosis not present

## 2018-05-03 LAB — URINALYSIS, ROUTINE W REFLEX MICROSCOPIC
Bilirubin Urine: NEGATIVE
GLUCOSE, UA: NEGATIVE mg/dL
HGB URINE DIPSTICK: NEGATIVE
KETONES UR: NEGATIVE mg/dL
Leukocytes, UA: NEGATIVE
Nitrite: NEGATIVE
PROTEIN: NEGATIVE mg/dL
Specific Gravity, Urine: 1.018 (ref 1.005–1.030)
pH: 5 (ref 5.0–8.0)

## 2018-05-03 LAB — CBC
HCT: 42.6 % (ref 36.0–46.0)
Hemoglobin: 14.5 g/dL (ref 12.0–15.0)
MCH: 27.9 pg (ref 26.0–34.0)
MCHC: 34 g/dL (ref 30.0–36.0)
MCV: 81.9 fL (ref 78.0–100.0)
PLATELETS: 400 10*3/uL (ref 150–400)
RBC: 5.2 MIL/uL — ABNORMAL HIGH (ref 3.87–5.11)
RDW: 13.9 % (ref 11.5–15.5)
WBC: 11.9 10*3/uL — ABNORMAL HIGH (ref 4.0–10.5)

## 2018-05-03 LAB — COMPREHENSIVE METABOLIC PANEL
ALT: 77 U/L — AB (ref 0–44)
AST: 27 U/L (ref 15–41)
Albumin: 4.2 g/dL (ref 3.5–5.0)
Alkaline Phosphatase: 111 U/L (ref 38–126)
Anion gap: 10 (ref 5–15)
BUN: 10 mg/dL (ref 6–20)
CHLORIDE: 105 mmol/L (ref 98–111)
CO2: 23 mmol/L (ref 22–32)
CREATININE: 0.67 mg/dL (ref 0.44–1.00)
Calcium: 9.2 mg/dL (ref 8.9–10.3)
GFR calc Af Amer: >60 mL/min (ref >60)
GFR calc non Af Amer: >60 mL/min (ref >60)
Glucose, Bld: 96 mg/dL (ref 70–99)
Potassium: 4 mmol/L (ref 3.5–5.1)
SODIUM: 138 mmol/L (ref 135–145)
Total Bilirubin: 0.6 mg/dL (ref 0.3–1.2)
Total Protein: 7.9 g/dL (ref 6.5–8.1)

## 2018-05-03 LAB — CBG MONITORING, ED: Glucose-Capillary: 88 mg/dL (ref 70–99)

## 2018-05-03 LAB — I-STAT BETA HCG BLOOD, ED (MC, WL, AP ONLY): I-stat hCG, quantitative: <5 m[IU]/mL (ref <5)

## 2018-05-03 LAB — LIPASE, BLOOD: LIPASE: 19 U/L (ref 11–51)

## 2018-05-03 MED ORDER — TRAMADOL HCL 50 MG PO TABS: 50.0000 mg | ORAL_TABLET | Freq: Four times a day (QID) | ORAL | 0 refills | Status: DC | PRN

## 2018-05-03 MED ORDER — IOPAMIDOL (ISOVUE-300) INJECTION 61%
100.0000 mL | Freq: Once | INTRAVENOUS | Status: AC | PRN
  Administered 2018-05-03: 100 mL via INTRAVENOUS

## 2018-05-03 MED ORDER — SODIUM CHLORIDE 0.9 % IV BOLUS
1000.0000 mL | Freq: Once | INTRAVENOUS | Status: AC
  Administered 2018-05-03: 1000 mL via INTRAVENOUS

## 2018-05-03 MED ORDER — IOPAMIDOL (ISOVUE-300) INJECTION 61%
INTRAVENOUS | Status: AC
  Filled 2018-05-03: qty 100

## 2018-05-03 MED ORDER — SODIUM CHLORIDE 0.9 % IV SOLN
INTRAVENOUS | Status: DC
Start: 2018-05-03 — End: 2018-05-03

## 2018-05-03 NOTE — ED Notes (Signed)
Patient transported to CT 
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
## 2018-05-03 NOTE — Discharge Instructions (Signed)
Work-up here today for the right lower quadrant and lower quadrant abdominal pain without any significant findings on CT scan.  Labs without any significant abnormalities.  Take the tramadol as directed.  Recommend following up with the [facility] if symptoms do not resolve.  Return for any new or worse symptoms.

## 2018-05-03 NOTE — ED Triage Notes (Signed)
Pt complains of pain in her abdomen, increased urinary frequency x 2 months. Pt denies n/v, states she had some diarrhea.

## 2018-06-08 ENCOUNTER — Other Ambulatory Visit: Payer: Self-pay

## 2018-06-08 ENCOUNTER — Emergency Department (HOSPITAL_BASED_OUTPATIENT_CLINIC_OR_DEPARTMENT_OTHER)
Admission: EM | Admit: 2018-06-08 | Discharge: 2018-06-08 | Disposition: A | Discharge status: Home / Self Care | Payer: Medicare Other | Attending: Emergency Medicine | Admitting: Emergency Medicine

## 2018-06-08 ENCOUNTER — Encounter (HOSPITAL_BASED_OUTPATIENT_CLINIC_OR_DEPARTMENT_OTHER): Payer: Self-pay | Admitting: Emergency Medicine

## 2018-06-08 DIAGNOSIS — I10 Essential (primary) hypertension: Secondary | ICD-10-CM | POA: Insufficient documentation

## 2018-06-08 DIAGNOSIS — Z91040 Latex allergy status: Secondary | ICD-10-CM | POA: Insufficient documentation

## 2018-06-08 DIAGNOSIS — F17210 Nicotine dependence, cigarettes, uncomplicated: Secondary | ICD-10-CM | POA: Diagnosis not present

## 2018-06-08 DIAGNOSIS — Z79899 Other long term (current) drug therapy: Secondary | ICD-10-CM | POA: Diagnosis not present

## 2018-06-08 DIAGNOSIS — G8918 Other acute postprocedural pain: Secondary | ICD-10-CM | POA: Insufficient documentation

## 2018-06-08 DIAGNOSIS — K0889 Other specified disorders of teeth and supporting structures: Secondary | ICD-10-CM | POA: Diagnosis not present

## 2018-06-08 DIAGNOSIS — Z85038 Personal history of other malignant neoplasm of large intestine: Secondary | ICD-10-CM | POA: Diagnosis not present

## 2018-06-08 DIAGNOSIS — J45909 Unspecified asthma, uncomplicated: Secondary | ICD-10-CM | POA: Diagnosis not present

## 2018-06-08 MED ORDER — CLINDAMYCIN HCL 300 MG PO CAPS: 300.0000 mg | ORAL_CAPSULE | Freq: Three times a day (TID) | ORAL | 0 refills | Status: DC

## 2018-06-08 MED ORDER — CLINDAMYCIN HCL 300 MG PO CAPS: 300.0000 mg | ORAL_CAPSULE | Freq: Four times a day (QID) | ORAL | 0 refills | Status: AC

## 2018-06-08 MED ORDER — OXYCODONE-ACETAMINOPHEN 5-325 MG PO TABS
1.0000 | ORAL_TABLET | Freq: Once | ORAL | Status: AC
  Administered 2018-06-08: 1 via ORAL
  Filled 2018-06-08: qty 1
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
## 2018-06-08 NOTE — ED Triage Notes (Signed)
Pt had 3 teeth pulled on Thursday. She ran out of pain medication yesterday. C/o pain to R side of mouth and headache.

## 2018-06-08 NOTE — Discharge Instructions (Addendum)
I have prescribed you antibiotics for you to take that will treat any dental infection you may have. Please call your oral surgeon/dentist tomorrow to schedule a sooner follow-up appointment.  For pain, you can use Tylenol with Ibuprofen. Also applying warm compresses to your face may provide additional relief.  Take care of yourself. Thank you for allowing me to take care of you today.

## 2018-08-27 ENCOUNTER — Emergency Department (HOSPITAL_COMMUNITY): Payer: Medicare Other

## 2018-08-27 ENCOUNTER — Emergency Department (HOSPITAL_COMMUNITY)
Admission: EM | Admit: 2018-08-27 | Discharge: 2018-08-27 | Disposition: A | Discharge status: Home / Self Care | Payer: Medicare Other | Attending: Emergency Medicine | Admitting: Emergency Medicine

## 2018-08-27 ENCOUNTER — Other Ambulatory Visit: Payer: Self-pay

## 2018-08-27 ENCOUNTER — Encounter (HOSPITAL_COMMUNITY): Payer: Self-pay | Admitting: Emergency Medicine

## 2018-08-27 DIAGNOSIS — R0602 Shortness of breath: Secondary | ICD-10-CM | POA: Diagnosis not present

## 2018-08-27 DIAGNOSIS — Z85038 Personal history of other malignant neoplasm of large intestine: Secondary | ICD-10-CM | POA: Diagnosis not present

## 2018-08-27 DIAGNOSIS — R0789 Other chest pain: Secondary | ICD-10-CM | POA: Diagnosis not present

## 2018-08-27 DIAGNOSIS — F17210 Nicotine dependence, cigarettes, uncomplicated: Secondary | ICD-10-CM | POA: Insufficient documentation

## 2018-08-27 DIAGNOSIS — Z91040 Latex allergy status: Secondary | ICD-10-CM | POA: Insufficient documentation

## 2018-08-27 DIAGNOSIS — I10 Essential (primary) hypertension: Secondary | ICD-10-CM | POA: Diagnosis not present

## 2018-08-27 DIAGNOSIS — Z79899 Other long term (current) drug therapy: Secondary | ICD-10-CM | POA: Insufficient documentation

## 2018-08-27 DIAGNOSIS — J069 Acute upper respiratory infection, unspecified: Secondary | ICD-10-CM | POA: Diagnosis not present

## 2018-08-27 LAB — CBC WITH DIFFERENTIAL/PLATELET
Abs Immature Granulocytes: 0.04 10*3/uL (ref 0.00–0.07)
Basophils Absolute: 0 10*3/uL (ref 0.0–0.1)
Basophils Relative: 0 %
Eosinophils Absolute: 0.3 10*3/uL (ref 0.0–0.5)
Eosinophils Relative: 3 %
HCT: 46 % (ref 36.0–46.0)
Hemoglobin: 14.7 g/dL (ref 12.0–15.0)
Immature Granulocytes: 1 %
Lymphocytes Relative: 27 %
Lymphs Abs: 2.3 10*3/uL (ref 0.7–4.0)
MCH: 26.7 pg (ref 26.0–34.0)
MCHC: 32 g/dL (ref 30.0–36.0)
MCV: 83.5 fL (ref 80.0–100.0)
MONO ABS: 1.2 10*3/uL — AB (ref 0.1–1.0)
Monocytes Relative: 14 %
Neutro Abs: 4.8 10*3/uL (ref 1.7–7.7)
Neutrophils Relative %: 55 %
Platelets: 291 10*3/uL (ref 150–400)
RBC: 5.51 MIL/uL — ABNORMAL HIGH (ref 3.87–5.11)
RDW: 14 % (ref 11.5–15.5)
WBC: 8.8 10*3/uL (ref 4.0–10.5)
nRBC: 0 % (ref 0.0–0.2)

## 2018-08-27 LAB — BASIC METABOLIC PANEL
Anion gap: 10 (ref 5–15)
BUN: 7 mg/dL (ref 6–20)
CALCIUM: 8.5 mg/dL — AB (ref 8.9–10.3)
CO2: 21 mmol/L — ABNORMAL LOW (ref 22–32)
Chloride: 107 mmol/L (ref 98–111)
Creatinine, Ser: 0.68 mg/dL (ref 0.44–1.00)
GFR calc Af Amer: >60 mL/min (ref >60)
GFR calc non Af Amer: >60 mL/min (ref >60)
Glucose, Bld: 169 mg/dL — ABNORMAL HIGH (ref 70–99)
Potassium: 3.5 mmol/L (ref 3.5–5.1)
Sodium: 138 mmol/L (ref 135–145)

## 2018-08-27 LAB — BRAIN NATRIURETIC PEPTIDE: B Natriuretic Peptide: 8.4 pg/mL (ref 0.0–100.0)

## 2018-08-27 LAB — D-DIMER, QUANTITATIVE: D-Dimer, Quant: <0.27 ug/mL-FEU (ref 0.00–0.50)

## 2018-08-27 LAB — I-STAT TROPONIN, ED: Troponin i, poc: 0 ng/mL (ref 0.00–0.08)

## 2018-08-27 MED ORDER — CYCLOBENZAPRINE HCL 10 MG PO TABS: 10.0000 mg | ORAL_TABLET | Freq: Three times a day (TID) | ORAL | 0 refills | Status: AC | PRN

## 2018-08-27 MED ORDER — PREDNISONE 20 MG PO TABS: 40.0000 mg | ORAL_TABLET | Freq: Every day | ORAL | 0 refills | Status: AC

## 2018-08-27 MED ORDER — GUAIFENESIN ER 1200 MG PO TB12: 1200.0000 mg | ORAL_TABLET | Freq: Two times a day (BID) | ORAL | 0 refills | Status: AC

## 2018-08-27 NOTE — ED Triage Notes (Signed)
Pt BIB [facility] from home, c/o intermittent chest pain x 1 week. Pain worsening in the last 2 days, pt developed a cough and shortness of breath as well. EMS VSS.

## 2018-08-27 NOTE — ED Notes (Signed)
Pt verbalized understanding of dc instructions, vss, nad, ambulatory out of ED with nad
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
## 2018-10-19 ENCOUNTER — Encounter (HOSPITAL_COMMUNITY): Payer: Self-pay | Admitting: Emergency Medicine

## 2018-10-19 ENCOUNTER — Other Ambulatory Visit: Payer: Self-pay

## 2018-10-19 ENCOUNTER — Emergency Department (HOSPITAL_COMMUNITY)
Admission: EM | Admit: 2018-10-19 | Discharge: 2018-10-19 | Discharge status: Against Medical Advice | Payer: Medicare Other | Attending: Emergency Medicine | Admitting: Emergency Medicine

## 2018-10-19 ENCOUNTER — Emergency Department (HOSPITAL_COMMUNITY): Payer: Medicare Other

## 2018-10-19 DIAGNOSIS — Z79899 Other long term (current) drug therapy: Secondary | ICD-10-CM | POA: Diagnosis not present

## 2018-10-19 DIAGNOSIS — J45909 Unspecified asthma, uncomplicated: Secondary | ICD-10-CM | POA: Insufficient documentation

## 2018-10-19 DIAGNOSIS — R197 Diarrhea, unspecified: Secondary | ICD-10-CM | POA: Diagnosis not present

## 2018-10-19 DIAGNOSIS — M545 Low back pain, unspecified: Secondary | ICD-10-CM

## 2018-10-19 DIAGNOSIS — F17210 Nicotine dependence, cigarettes, uncomplicated: Secondary | ICD-10-CM | POA: Diagnosis not present

## 2018-10-19 DIAGNOSIS — I10 Essential (primary) hypertension: Secondary | ICD-10-CM | POA: Insufficient documentation

## 2018-10-19 LAB — I-STAT BETA HCG BLOOD, ED (MC, WL, AP ONLY): I-stat hCG, quantitative: <5 m[IU]/mL (ref <5)

## 2018-10-19 NOTE — ED Notes (Signed)
Labs have been sent to Lab downstairs and Dark Nyoka Cowden top has been placed in ED Mini-Lab until further orders are placed by ED Provider.

## 2018-10-19 NOTE — ED Triage Notes (Signed)
Pt c/o lower back pains for couple days. Denies falls or injuries or lifting. Denies urinary problems. Reports some diarrhea x week.
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
## 2018-10-19 NOTE — ED Notes (Signed)
Patient stated that patient had emergency and had to leave. RN removed IV and patient has left [facility].

## 2018-10-22 IMAGING — CT CT ABD-PELV W/O CM
2 of 4 series · 17 of 46 positions shown, 19 images · non-contrast
Comparison: 03/26/2018

CLINICAL DATA: Abdominal pain.

EXAM:
CT ABDOMEN AND PELVIS WITHOUT CONTRAST
TECHNIQUE: Multidetector CT imaging of the abdomen and pelvis was performed
following the standard protocol without IV contrast.

[Series 2: axial st · axial · 0.78mm/px · z∈[+946,+1372]mm · 14 of 95 slices shown, 16 images]
[im 5/95  soft-tissue]
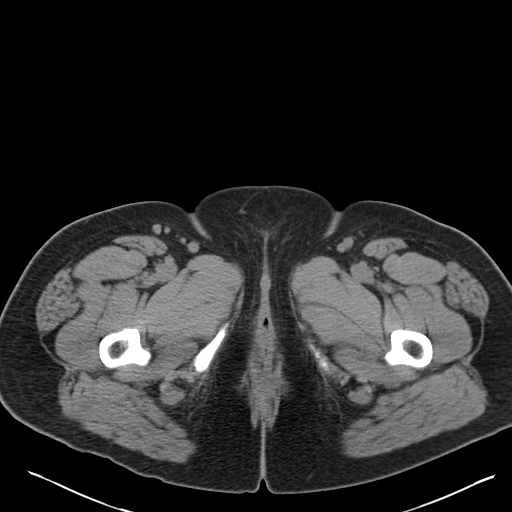
[im 5/95  bone]
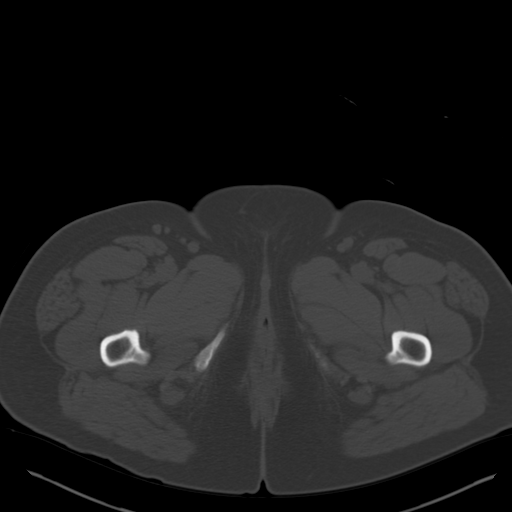
[im 15/95  soft-tissue]
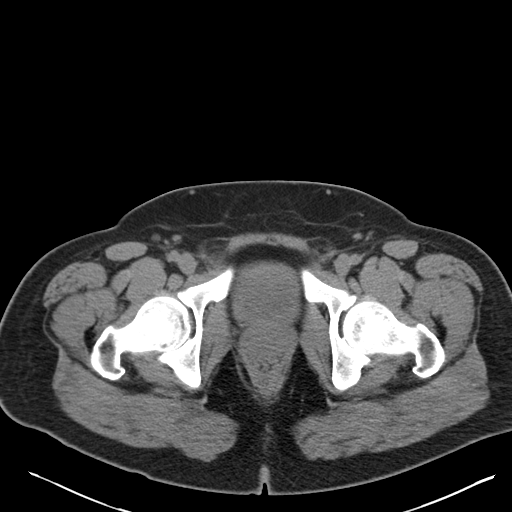
[im 19/95  soft-tissue]
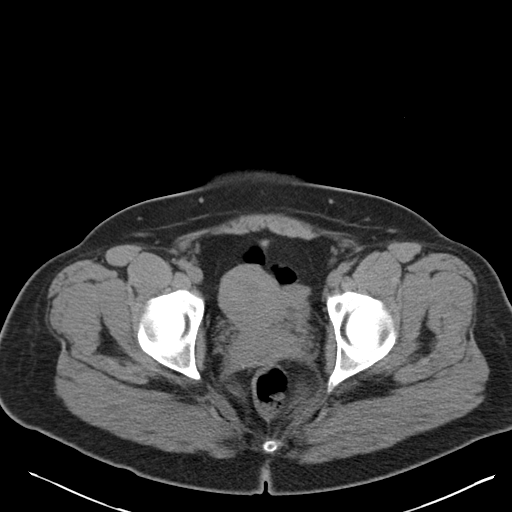
[im 24/95  soft-tissue]
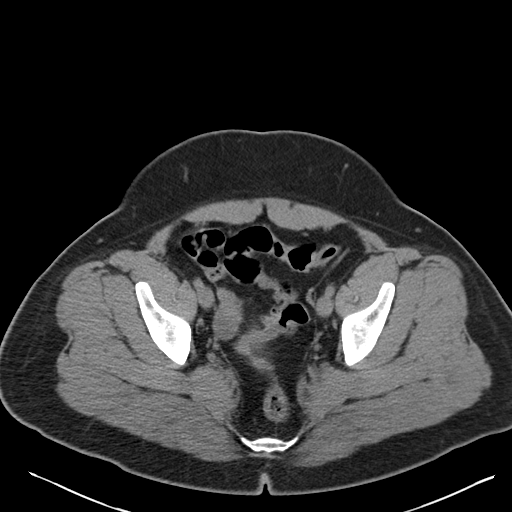
[im 33/95  soft-tissue]
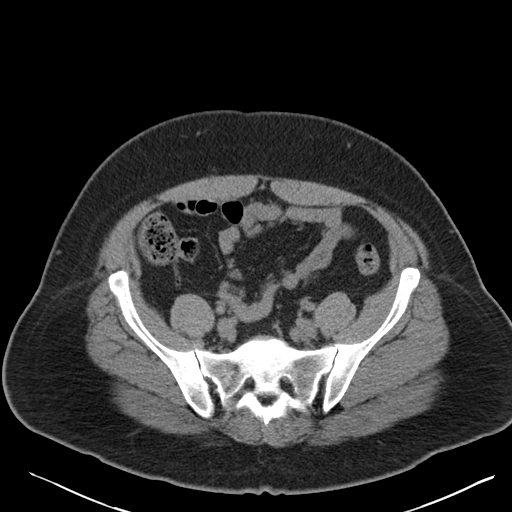
[im 38/95  soft-tissue]
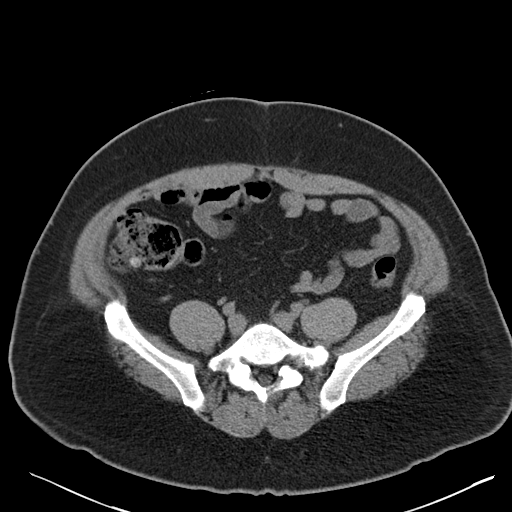
[im 43/95  soft-tissue]
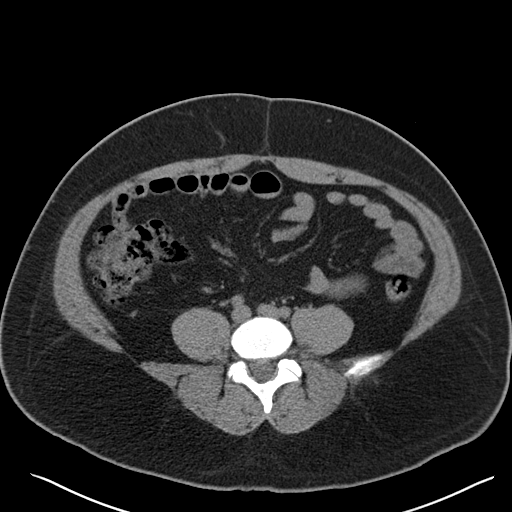
[im 52/95  soft-tissue]
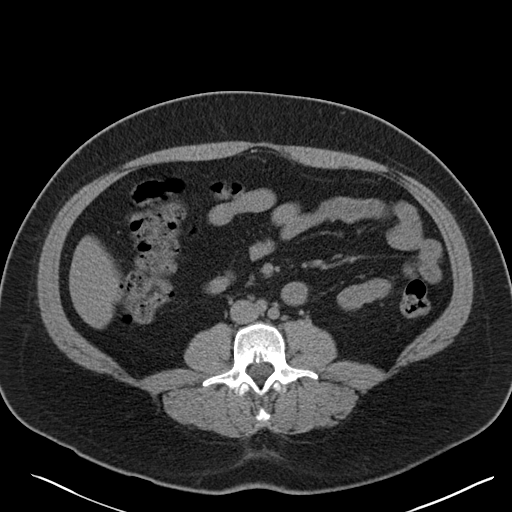
[im 57/95  soft-tissue]
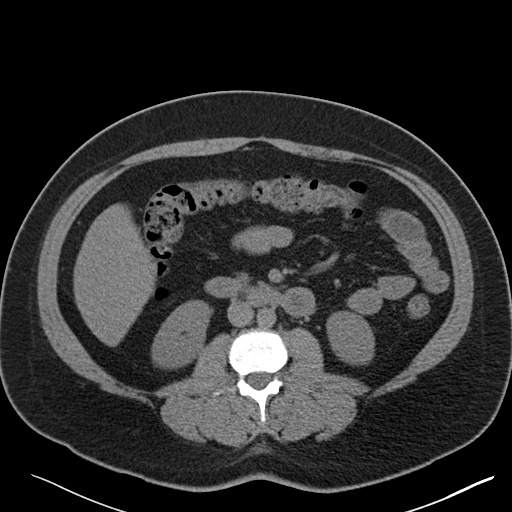
[im 57/95  bone]
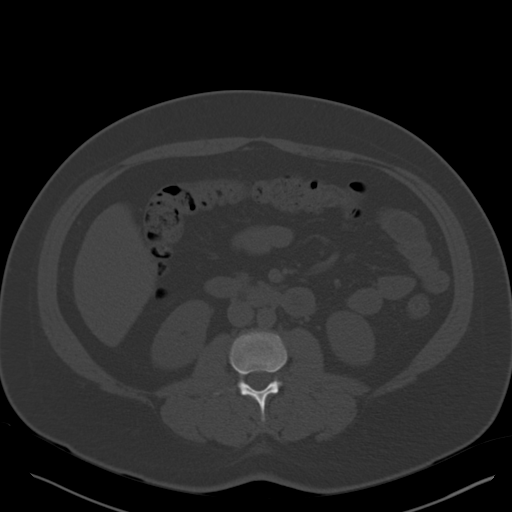
[im 62/95  soft-tissue]
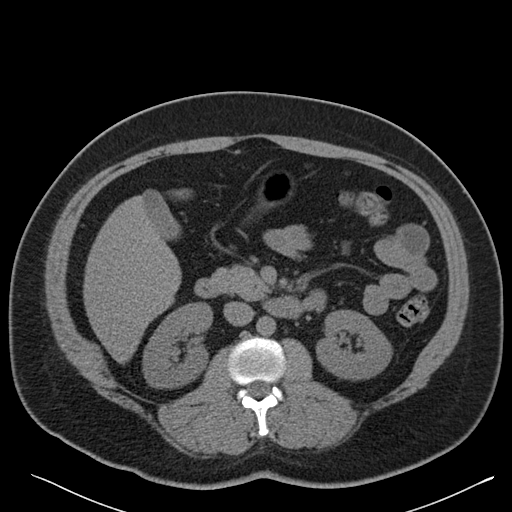
[im 71/95  soft-tissue]
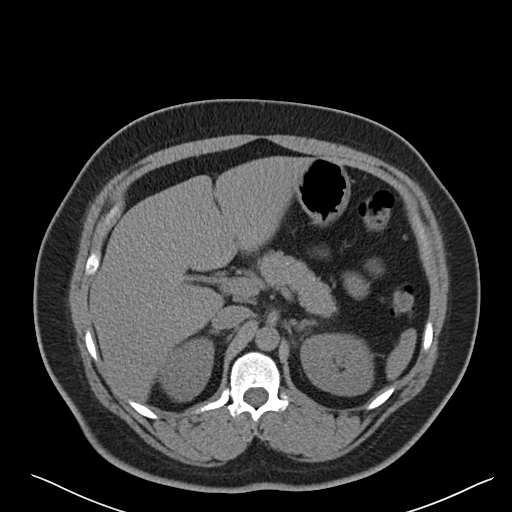
[im 76/95  soft-tissue]
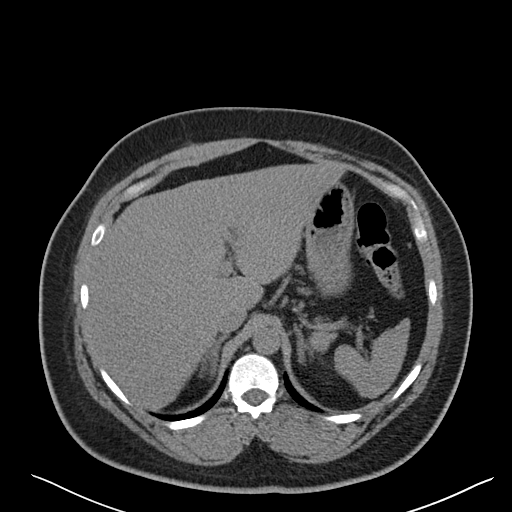
[im 80/95  soft-tissue]
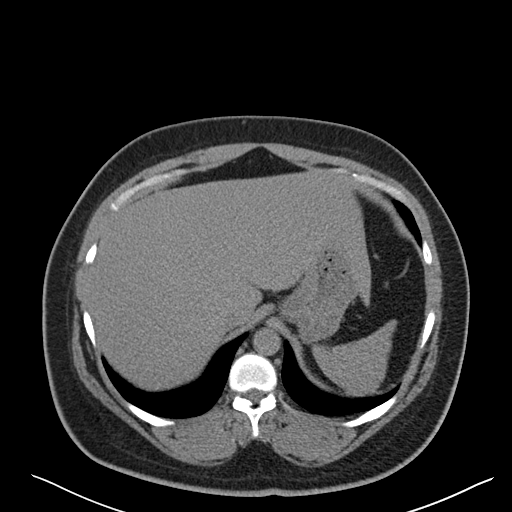
[im 90/95  soft-tissue]
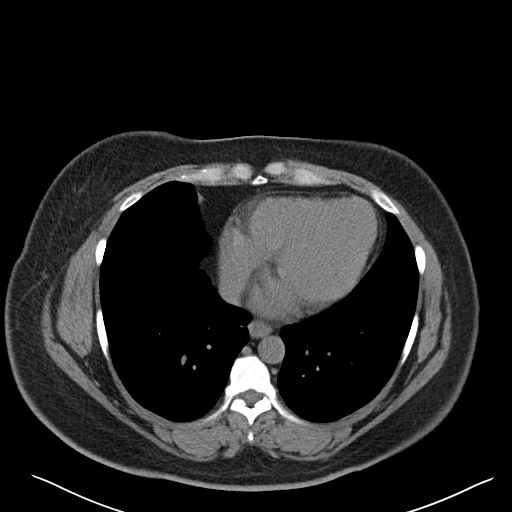

[Series 5: coronal st · coronal · 0.89mm/px · 3 of 118 slices shown]
[im 40/118  soft-tissue]
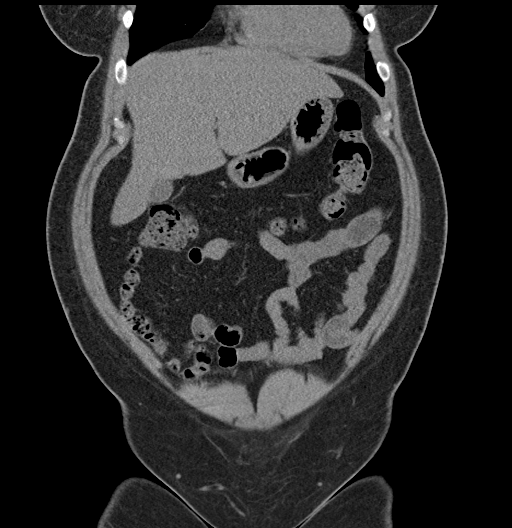
[im 53/118  soft-tissue]
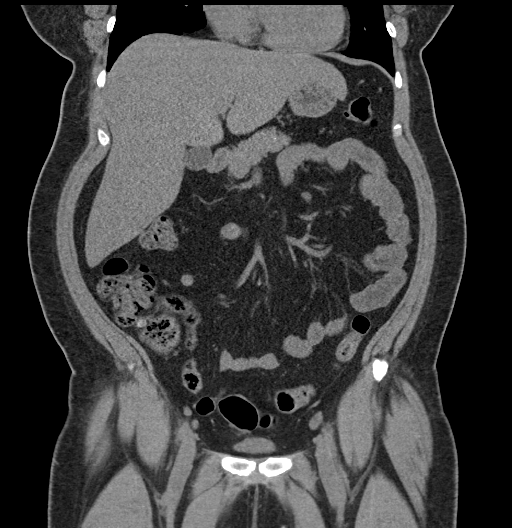
[im 66/118  soft-tissue]
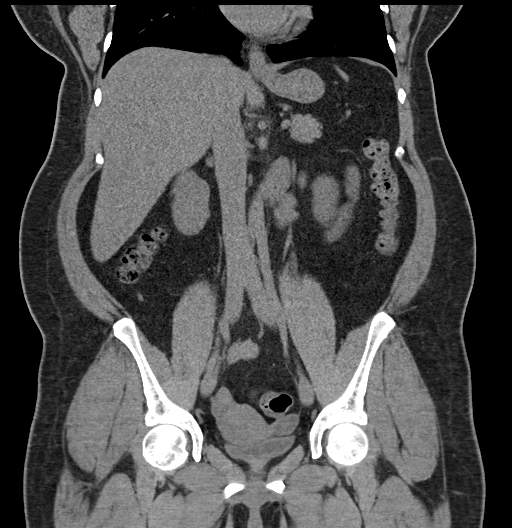

[17 of 46 positions shown; findings below may reference images not displayed]

FINDINGS: Lower chest: Lung bases are clear. No effusions. Heart is normal
size.

Hepatobiliary: No focal hepatic abnormality. Gallbladder
unremarkable.

Pancreas: No focal abnormality or ductal dilatation.

Spleen: No focal abnormality.  Normal size.

Adrenals/Urinary Tract: No adrenal abnormality. No focal renal
abnormality. No stones or hydronephrosis. Urinary bladder is
unremarkable.

Stomach/Bowel: Normal appendix. Stomach, large and small bowel
grossly unremarkable.

Vascular/Lymphatic: No evidence of aneurysm or adenopathy.

Reproductive: Uterus and adnexa unremarkable.  No mass.

Other: No free fluid or free air.

Musculoskeletal: No acute bony abnormality.
IMPRESSION: Normal study.

## 2019-10-11 ENCOUNTER — Inpatient Hospital Stay: Admit: 2019-10-11 | Discharge: 2019-10-11 | Payer: MEDICARE

## 2019-10-11 ENCOUNTER — Emergency Department: Admit: 2019-10-11 | Payer: PRIVATE HEALTH INSURANCE

## 2019-10-11 DIAGNOSIS — R110 Nausea: Secondary | ICD-10-CM

## 2019-10-11 DIAGNOSIS — M549 Dorsalgia, unspecified: Principal | ICD-10-CM

## 2019-10-11 DIAGNOSIS — R109 Unspecified abdominal pain: Secondary | ICD-10-CM

## 2019-10-11 LAB — CBC WITH AUTO DIFFERENTIAL
BKR WAM ABSOLUTE IMMATURE GRANULOCYTES: 0.1 x 1000/ÂµL (ref 0.0–0.4)
BKR WAM ABSOLUTE LYMPHOCYTE COUNT: 4.3 x 1000/ÂµL (ref 0.5–5.4)
BKR WAM ABSOLUTE NRBC: 0 x 1000/ÂµL
BKR WAM ANALYZER ANC: 7.4 x 1000/ÂµL — ABNORMAL HIGH (ref 2.2–7.2)
BKR WAM BASOPHIL ABSOLUTE COUNT: 0.1 x 1000/ÂµL (ref 0.0–0.2)
BKR WAM BASOPHILS: 0.4 % (ref 0.0–2.0)
BKR WAM EOSINOPHIL ABSOLUTE COUNT: 0.3 x 1000/ÂµL (ref 0.0–0.4)
BKR WAM EOSINOPHILS: 2.5 % (ref 0.0–4.0)
BKR WAM HEMATOCRIT: 43.3 % (ref 36.0–48.0)
BKR WAM HEMOGLOBIN: 14.5 g/dL (ref 12.0–15.0)
BKR WAM IMMATURE GRANULOCYTES: 0.4 % (ref 0.0–0.4)
BKR WAM LYMPHOCYTES: 32 % (ref 10.0–50.0)
BKR WAM MCH (PG): 27.7 pg (ref 25.0–35.0)
BKR WAM MCHC: 33.5 g/dL (ref 33.0–37.0)
BKR WAM MCV: 82.6 fL (ref 81.0–99.0)
BKR WAM MONOCYTE ABSOLUTE COUNT: 1.3 x 1000/ÂµL — ABNORMAL HIGH (ref 0.1–1.2)
BKR WAM MONOCYTES: 9.4 % (ref 3.0–11.0)
BKR WAM MPV: 9.9 fL (ref 8.0–12.0)
BKR WAM NEUTROPHILS: 55.3 % (ref 45.0–90.0)
BKR WAM NUCLEATED RED BLOOD CELLS: 0 % (ref 0.0–0.0)
BKR WAM PLATELETS: 383 x1000/ÂµL (ref 120–450)
BKR WAM RDW-CV: 14.3 % (ref 11.5–14.5)
BKR WAM RED BLOOD CELL COUNT: 5.2 M/ÂµL (ref 3.5–5.5)
BKR WAM WHITE BLOOD CELL COUNT: 13.4 x1000/ÂµL — ABNORMAL HIGH (ref 4.8–10.8)

## 2019-10-11 MED ORDER — TRAMADOL 50 MG TABLET
50 mg | ORAL_TABLET | Freq: Four times a day (QID) | ORAL | 1 refills | Status: AC | PRN
Start: 2019-10-11 — End: ?

## 2019-10-11 MED ORDER — TRAMADOL 50 MG TABLET
50 mg | ORAL_TABLET | Freq: Four times a day (QID) | ORAL | 1 refills | Status: AC | PRN
Start: 2019-10-11 — End: 2019-10-12

## 2019-10-11 MED ORDER — MORPHINE 4 MG/ML INTRAVENOUS SOLUTION
4 mg/mL | Freq: Once | SUBCUTANEOUS | Status: CP
Start: 2019-10-11 — End: ?
  Administered 2019-10-11: 23:00:00 4 mL via SUBCUTANEOUS

## 2019-10-12 DIAGNOSIS — S39012A Strain of muscle, fascia and tendon of lower back, initial encounter: Secondary | ICD-10-CM

## 2019-10-12 DIAGNOSIS — Z881 Allergy status to other antibiotic agents status: Secondary | ICD-10-CM

## 2019-10-12 DIAGNOSIS — Z886 Allergy status to analgesic agent status: Secondary | ICD-10-CM

## 2019-10-12 DIAGNOSIS — Z7951 Long term (current) use of inhaled steroids: Secondary | ICD-10-CM

## 2019-10-12 DIAGNOSIS — Z888 Allergy status to other drugs, medicaments and biological substances status: Secondary | ICD-10-CM

## 2019-10-12 LAB — COMPREHENSIVE METABOLIC PANEL
BKR A/G RATIO: 1.2 (ref 1.0–2.2)
BKR ALANINE AMINOTRANSFERASE (ALT): 34 U/L (ref 10–35)
BKR ALBUMIN: 4.2 g/dL (ref 3.6–4.9)
BKR ALKALINE PHOSPHATASE: 102 U/L (ref 9–122)
BKR ANION GAP: 14 (ref 7–17)
BKR ASPARTATE AMINOTRANSFERASE (AST): 25 U/L (ref 10–35)
BKR BILIRUBIN TOTAL: 0.2 mg/dL (ref <=1.2)
BKR BLOOD UREA NITROGEN: 12 mg/dL (ref 6–20)
BKR BUN / CREAT RATIO: 15.8 (ref 8.0–23.0)
BKR CALCIUM: 9.7 mg/dL (ref 8.8–10.2)
BKR CHLORIDE: 104 mmol/L (ref 98–107)
BKR CO2: 22 mmol/L (ref 20–30)
BKR CREATININE: 0.76 mg/dL (ref 0.40–1.30)
BKR EGFR (AFR AMER): >60 mL/min/{1.73_m2} (ref >60)
BKR EGFR (NON AFRICAN AMERICAN): >60 mL/min/{1.73_m2} (ref >60)
BKR GLOBULIN: 3.5 g/dL
BKR GLUCOSE: 126 mg/dL — ABNORMAL HIGH (ref 70–100)
BKR POTASSIUM: 3.8 mmol/L (ref 3.3–5.1)
BKR PROTEIN TOTAL: 7.7 g/dL (ref 6.6–8.7)
BKR SODIUM: 140 mmol/L (ref 136–144)

## 2019-10-12 LAB — PROTIME AND INR
BKR INR: 0.92 (ref 0.87–1.13)
BKR PROTHROMBIN TIME: 10.4 s (ref 9.5–12.1)

## 2019-10-12 LAB — URINALYSIS WITH CULTURE REFLEX      (BH LMW YH)
BKR BILIRUBIN, UA: NEGATIVE
BKR BLOOD, UA: NEGATIVE
BKR GLUCOSE, UA: NEGATIVE
BKR LEUKOCYTE ESTERASE, UA: NEGATIVE
BKR NITRITE, UA: NEGATIVE
BKR PH, UA: 6 (ref 5.5–7.5)
BKR SPECIFIC GRAVITY, UA: 1.034 — ABNORMAL HIGH (ref 1.005–1.030)
BKR UROBILINOGEN, UA: <2 EU/dL (ref <=2.0)

## 2019-10-12 LAB — MAGNESIUM: BKR MAGNESIUM: 2 mg/dL (ref 1.7–2.4)

## 2019-10-12 LAB — UA REFLEX CULTURE

## 2019-10-12 NOTE — ED Provider Notes
HistoryChief Complaint Patient presents with ? Back Pain   midback pain x 4 days, denies falls  ? Abdominal Pain   right sided abdominal x 4 days, +nausea, no vomiting/diarrhea or fevers, last BM 2 days ago  44 year old female presents to emergency department complaining of back pain.  Patient states that she has had upper lumbar back pain for last 4 days.  She denies any falls or injuries.  Has had recurrent back pain in the past with no clear diagnosis.  No lower extremity weakness or paresthesias.  No bladder or bowel incontinence.  No urinary retention.  No fevers or chills.  Patient also has some vague right-sided abdominal pain with nausea but no vomiting or diarrhea.  No anorexia.  No urinary complaints. The history is provided by the patient and medical records. OtherThis is a new problem. The current episode started more than 2 days ago. The problem occurs constantly. The problem has been gradually worsening. Associated symptoms include abdominal pain. Pertinent negatives include no headaches and no shortness of breath. Nothing aggravates the symptoms. Nothing relieves the symptoms.  No past medical history on file.No past surgical history on file.No family history on file.Social History Socioeconomic History ? Marital status: Single   Spouse name: Not on file ? Number of children: Not on file ? Years of education: Not on file ? Highest education level: Not on file No existing history information found.No existing history information found.No existing history information found.Review of Systems Constitutional: Negative for chills and fever. HENT: Negative for congestion, rhinorrhea and sore throat.  Eyes: Negative for visual disturbance. Respiratory: Negative for shortness of breath.  Gastrointestinal: Positive for abdominal pain, diarrhea and nausea. Negative for vomiting. Genitourinary: Negative for dysuria, frequency and urgency. Musculoskeletal: Positive for back pain. Negative for gait problem, neck pain and neck stiffness. Skin: Negative for rash. Neurological: Negative for dizziness, weakness and headaches. Hematological: Negative for adenopathy.  Physical ExamED Triage Vitals [10/11/19 1800]BP: (!) 139/92Pulse: 79Pulse from  O2 sat: n/aResp: 18Temp: 97.1 ?F (36.2 ?C)Temp src: TemporalSpO2: 97 % BP (!) 139/92  - Pulse 79  - Temp 97.1 ?F (36.2 ?C) (Temporal)  - Resp 18  - Wt 95.3 kg (210 lb 1.6 oz)  - SpO2 97% Physical ExamVitals signs and nursing note reviewed. Constitutional:     General: She is not in acute distress.   Appearance: She is well-developed. HENT:    Head: Normocephalic and atraumatic.    Mouth/Throat:    Mouth: Mucous membranes are moist. Eyes:    Conjunctiva/sclera: Conjunctivae normal. Cardiovascular:    Rate and Rhythm: Normal rate and regular rhythm. Pulmonary:    Effort: Pulmonary effort is normal.    Breath sounds: Normal breath sounds. Abdominal:    General: Bowel sounds are normal.    Palpations: Abdomen is soft.    Tenderness: There is no abdominal tenderness. Musculoskeletal: Normal range of motion.    Lumbar back: She exhibits pain. She exhibits no tenderness, no bony tenderness and no spasm. Skin:   General: Skin is warm and dry.    Findings: No rash. Neurological:    General: No focal deficit present.    Mental Status: She is alert.    Sensory: No sensory deficit.    Motor: No weakness.    Coordination: Coordination normal.    Gait: Gait normal. Psychiatric:       Mood and Affect: Mood normal.  ProceduresProcedures ED Course Labs obtained.Medicated for pain.Due to the abdominal pain a [place] scan abdomen and pelvis was  ordered.[place] is unremarkable.  Laboratory workup was unremarkable.Patient is feeling much better.Discharged home with pain medication and educated on return precautions.  Will follow up with her PMD for further care.Clinical Impressions as of Oct 10 2126 Back strain, initial encounter  ED DispositionDischargePortions of this note were transcribed using speech recognition software and may therefore contain transcription errors despite efforts to minimize such.  Please do not hesitate to contact me to request clarification if needed.Evaluation, management, and disposition decisions made in the context of COVID pandemic.  In this patient, the increased risk of nosocomial infection is of particular concern.  In my judgment, the balance of clinical factors dictated expedited evaluation and discharged from the ED in the interest of this patient's health and safety. Johny Shears, MD02/28/21 [phone]

## 2020-07-31 ENCOUNTER — Inpatient Hospital Stay: Admit: 2020-07-31 | Discharge: 2020-07-31 | Payer: MEDICARE

## 2020-07-31 ENCOUNTER — Encounter: Admit: 2020-07-31 | Payer: PRIVATE HEALTH INSURANCE | Attending: Emergency Medicine

## 2020-07-31 ENCOUNTER — Emergency Department: Admit: 2020-07-31 | Payer: PRIVATE HEALTH INSURANCE

## 2020-07-31 DIAGNOSIS — Z888 Allergy status to other drugs, medicaments and biological substances status: Secondary | ICD-10-CM

## 2020-07-31 DIAGNOSIS — Z7951 Long term (current) use of inhaled steroids: Secondary | ICD-10-CM

## 2020-07-31 DIAGNOSIS — S82832A Other fracture of upper and lower end of left fibula, initial encounter for closed fracture: Secondary | ICD-10-CM

## 2020-07-31 DIAGNOSIS — Z91018 Allergy to other foods: Secondary | ICD-10-CM

## 2020-07-31 DIAGNOSIS — J45909 Unspecified asthma, uncomplicated: Secondary | ICD-10-CM

## 2020-07-31 DIAGNOSIS — Z886 Allergy status to analgesic agent status: Secondary | ICD-10-CM

## 2020-07-31 DIAGNOSIS — I10 Essential (primary) hypertension: Secondary | ICD-10-CM

## 2020-07-31 DIAGNOSIS — M25572 Pain in left ankle and joints of left foot: Secondary | ICD-10-CM

## 2020-07-31 DIAGNOSIS — M79672 Pain in left foot: Principal | ICD-10-CM

## 2020-07-31 DIAGNOSIS — Z79899 Other long term (current) drug therapy: Secondary | ICD-10-CM

## 2020-07-31 DIAGNOSIS — Z881 Allergy status to other antibiotic agents status: Secondary | ICD-10-CM

## 2020-07-31 DIAGNOSIS — S92355A Nondisplaced fracture of fifth metatarsal bone, left foot, initial encounter for closed fracture: Secondary | ICD-10-CM

## 2020-07-31 DIAGNOSIS — Z880 Allergy status to penicillin: Secondary | ICD-10-CM

## 2020-07-31 DIAGNOSIS — K529 Noninfective gastroenteritis and colitis, unspecified: Secondary | ICD-10-CM

## 2020-07-31 DIAGNOSIS — Z9109 Other allergy status, other than to drugs and biological substances: Secondary | ICD-10-CM

## 2020-07-31 MED ORDER — ACETAMINOPHEN 325 MG TABLET
325 mg | Freq: Once | ORAL | Status: CP
Start: 2020-07-31 — End: ?
  Administered 2020-07-31: 22:00:00 325 mg via ORAL

## 2020-07-31 MED ORDER — CETIRIZINE 5 MG TABLET
5 mg | Freq: Every day | ORAL | Status: AC
Start: 2020-07-31 — End: ?

## 2020-07-31 MED ORDER — AMLODIPINE 5 MG TABLET
5 mg | Freq: Every day | ORAL | Status: AC
Start: 2020-07-31 — End: ?

## 2020-07-31 NOTE — Discharge Instructions
Continue with tylenol every 4 hours as needed for pain. Continue to use crutches, but do not bear weight. Follow up with podiatry at the contact provided. Please come back to the ED if you experiencing worsening symptoms or if you lose sensation in toes, notice discoloration of toes, or significant pain.Thank you for your patience!

## 2020-07-31 NOTE — ED Notes
3:49 PM Pt here with left ankle pain. On Tuesday she was walking down her steps- missed the last step and states she rolled her ankle. She went to an urgent care and had an x ray which was negative per pt. She was told to wrap it ws a sprained ankle and to take tylenol and use crutches to move around. She has difficulty with crutches so she is not using them, wears the ace wrap but states its not helping. States the pain is worse today. Upon exam she is sitting on recliner chair in hallway. Her breathing is even and unlabored. Her vitals are stable. Ace wrap to left leg/ankle. Awaiting provider dispo. 4:39 PM Ankle being wrapped by tech at this timer per MD order. Tylenol per order.

## 2020-08-01 NOTE — ED Provider Notes
HistoryChief Complaint Patient presents with ? Foot Pain   fell tuesday seen at walk in negative fx but tylenol not helping left foot  44 year old female presents with complaints of left ankle pain in the setting of recent injury 5 days ago.  Patient reports she had an inversion twisting injury while walking downstairs. She was evaluated same day in the urgent care setting.  Imaging at that time was reportedly negative, patient diagnosed with ankle sprain.  She is provided with crutches and Tylenol for pain relief.  Since that time, patient has continued to have left ankle pain, especially upon bearing weight.  Denies any relief with Tylenol, ice, or heat.  Although she was provided with crutches, she still tends to bear some weight with moderate difficulty.  Reports aggravating pain with any attempts at ranging the ankle.  She denies any numbness, weakness or any further injury. Past Medical History: Diagnosis Date ? Asthma  ? Colitis  ? Hypertension  Past Surgical History: Procedure Laterality Date ? FOOT AMPUTATION   ? HEMORROIDECTOMY   History reviewed. No pertinent family history.Social History Socioeconomic History ? Marital status: Single   Spouse name: Not on file ? Number of children: Not on file ? Years of education: Not on file ? Highest education level: Not on file ED Other Social History E-cigarette/Vaping Substances E-cigarette/Vaping Devices Review of Systems Respiratory: Negative for shortness of breath.  Cardiovascular: Negative for chest pain. Musculoskeletal: Positive for gait problem and joint swelling. Negative for arthralgias and myalgias. Allergic/Immunologic: Negative for immunocompromised state. Neurological: Negative for weakness and numbness.  Physical ExamED Triage Vitals [07/31/20 1417]BP: (!) 143/87Pulse: 81Pulse from  O2 sat: n/aResp: 18Temp: 98.1 ?F (36.7 ?C)Temp src: OralSpO2: 100 % BP (!) 155/97  - Pulse 78  - Temp 97.5 ?F (36.4 ?C) (Oral)  - Resp 18  - Wt 97.5 kg (215 lb)  - SpO2 (!) 18% Physical ExamConstitutional:     General: She is not in acute distress.   Appearance: Normal appearance. HENT:    Head: Normocephalic and atraumatic. Cardiovascular:    Rate and Rhythm: Normal rate and regular rhythm.    Heart sounds: No murmur heard. Pulmonary:    Effort: Pulmonary effort is normal.    Breath sounds: Normal breath sounds. Musculoskeletal:    Comments: Left Ankle/Foot:Focal swelling surrounding lateral malleolus, no overlying ecchymosis or erythema.Mildly tender lateral malleolus to be palpation, no other focal bony tenderness.Dorsal and volar aspect of foot nontender.No evidence of ligament instability.Limited active plantar and dorsiflexion.Limited passive plantar and dorsiflexion secondary to pain as well as inversion and eversion.Neurovascular intact distally.LLE:No calf tenderness, knee or proximal leg tenderness.No pelvic instability. Skin:   General: Skin is warm and dry. Neurological:    Mental Status: She is alert and oriented to person, place, and time.  ProceduresProceduresResident/APP MDM:44 year old female presents emergency department complaints of left ankle pain in the setting of recent twisting injury 5 days ago while walking downstairs.  Patient was evaluated in urgent care with imaging at that time was negative, diagnosed with ankle sprain.  Since that time she continues to have pain, primarily with bearing weight.  On exam, patient appears well, no acute distress.  Left ankle swelling surrounding lateral malleolus.  No overlying ecchymosis.  No significant bony tenderness to deep palpation.  Limited active and passive range of motion with dorsi and plantar flexion secondary to pain.  3/5 strength with plantar dorsiflexion.  Neurovascular intact.  DP and PT pulses 2+. X-ray of left foot resulted prior to my  evaluation shows evidence of small ossific density abutting the distal fibula suggestive of age-indeterminate fracture and old-appearing 5th metatarsal fracture.  Discussed with patient, she has no recollection of previous fractures.  Will place posterior short-leg splint with stirrup and have patient follow-up with Podiatry.--ED Course--Short posterior leg splint with stirrup placed without complication.  Patient neurovascular intact.  Patient advised to follow-up with podiatry.  Referral placed.  Patient agreeable discharge instructions.Discharge instructions provided.  All patient questions regarding my clinical impression, results of labs and imaging, follow-up, and any additional questions answered to patient's satisfaction.  Strict return precautions discussed.  Patient verbalized understanding.  Discharged in stable condition.?ED CourseClinical Impressions as of Aug 01 1349 Closed fracture of distal end of left fibula, unspecified fracture morphology, initial encounter Nondisplaced fracture of fifth metatarsal bone, left foot, initial encounter for closed fracture  ED DispositionDischarge Eligha Bridegroom, PA12/19/21 2014I, Dr.Elijan Googe, discussed evaluation, medical decision-making and plan with Eligha Bridegroom, PA. I personally reviewed the images and discussed plan. Dema Severin, DO Dema Severin, DO12/20/21 1351

## 2020-09-26 ENCOUNTER — Inpatient Hospital Stay: Admit: 2020-09-26 | Discharge: 2020-09-26 | Payer: MEDICARE

## 2020-09-26 MED ORDER — TRAMADOL 50 MG TABLET
50 mg | Freq: Once | ORAL | Status: CP
Start: 2020-09-26 — End: ?
  Administered 2020-09-26: 22:00:00 50 mg via ORAL

## 2020-09-26 MED ORDER — LIDOCAINE MAALOX DIPHEN 1:1:1 MAGIC MOUTHWASH (COMPOUND)
Freq: Three times a day (TID) | ORAL | 1 refills | Status: AC | PRN
Start: 2020-09-26 — End: ?

## 2020-09-27 NOTE — ED Provider Notes
HistoryChief Complaint Patient presents with ? Dental Pain   tooth pulled  today    taking   tylenol/amox   still pain  The history is provided by the patient and medical records. Dental PainLocation:  LowerLower teeth location:  22/LL cuspidQuality:  Throbbing and achingSeverity:  Unable to specifyOnset quality:  GradualDuration: few days.Timing:  ConstantProgression:  Waxing and waningChronicity:  NewContext comment:  Tooth extractionPrevious work-up:  Dental examRelieved by:  NothingWorsened by:  Cold food/drink and hot food/drinkIneffective treatments: amox.Associated symptoms: no difficulty swallowing, no drooling, no facial swelling, no fever, no gum swelling, no neck pain, no oral bleeding and no trismus   Past Medical History: Diagnosis Date ? Asthma  ? Colitis  ? Hypertension  Past Surgical History: Procedure Laterality Date ? FOOT AMPUTATION   ? HEMORROIDECTOMY   No family history on file.Social History Socioeconomic History ? Marital status: Single   Spouse name: Not on file ? Number of children: Not on file ? Years of education: Not on file ? Highest education level: Not on file Tobacco Use ? Smoking status: Current Every Day Smoker ED Other Social History E-cigarette/Vaping Substances E-cigarette/Vaping Devices Review of Systems Constitutional: Negative for activity change, appetite change, chills and fever. HENT: Positive for dental problem. Negative for drooling, facial swelling and trouble swallowing.  Musculoskeletal: Negative for neck pain and neck stiffness. Neurological: Negative for dizziness and syncope. Hematological: Does not bruise/bleed easily. Psychiatric/Behavioral: Negative for confusion and decreased concentration. All other systems reviewed and are negative. Physical ExamED Triage Vitals [09/26/20 1716]BP: (!) 164/98Pulse: 62Pulse from O2 sat: n/aResp: 20Temp: 97 ?F (36.1 ?C)Temp src: OralSpO2: 99 % BP (!) 164/98  - Pulse 62  - Temp 97 ?F (36.1 ?C) (Oral)  - Resp 20  - Wt 106.7 kg (235 lb 3.7 oz)  - SpO2 99%  - BMI 41.67 kg/m? Physical ExamVitals and nursing note reviewed. Exam conducted with a chaperone present. Constitutional:     General: She is not in acute distress.   Appearance: Normal appearance. She is obese. She is not ill-appearing, toxic-appearing or diaphoretic. HENT:    Head: Normocephalic and atraumatic.    Jaw: There is normal jaw occlusion.    Right Ear: External ear normal.    Left Ear: External ear normal.    Mouth/Throat:    Mouth: Mucous membranes are moist.    Dentition: No gingival swelling or dental abscesses.    Comments: S/p tooth extraction with socket. No active bleeding noted. Dried blood in socket.Eyes:    Extraocular Movements: Extraocular movements intact.    Conjunctiva/sclera: Conjunctivae normal.    Pupils: Pupils are equal, round, and reactive to light. Cardiovascular:    Rate and Rhythm: Normal rate and regular rhythm. Pulmonary:    Effort: Pulmonary effort is normal. No respiratory distress. Abdominal:    General: There is no distension. Musculoskeletal:       General: No deformity. Normal range of motion.    Cervical back: Normal range of motion and neck supple. No rigidity. Skin:   General: Skin is warm and dry.    Capillary Refill: Capillary refill takes less than 2 seconds. Neurological:    General: No focal deficit present.    Mental Status: She is alert and oriented to person, place, and time.    Cranial Nerves: No cranial nerve deficit.    Sensory: No sensory deficit.    Motor: No weakness.    Coordination: Coordination normal.    Gait: Gait normal. Psychiatric:  Mood and Affect: Mood normal.       Behavior: Behavior normal.       Thought Content: Thought content normal.       Judgment: Judgment normal.  ProceduresProcedures ED COURSEReviewed previous: previous chartInterpreted by ED Provider: pulse oximetryPatient Reevaluation: Pt pw co pain to area where her tooth was extracted. Pt taking prescribed amox without improvement. She has not been taking anything else. No f/c.She is alert and in nad. Afebrile. Pe as noted above. eval most cw dry socket. Provide tramadol in ED then prescribe magic mouthwash and provide care instructions. Pt instructed to continue prescrbed abx, fu with her dentist.Pt informed of t my clinical impression, treatment recommendations, the risks, benefits, and indications of prescribed medications, and disposition plan.  Pt instructed to read labels/package inserts for any medications prescribed and to call with any additional questions/concerns.  All questions answered to her satisfaction and she expressed understanding and comfort with this.  Reasons for returning to the ED sooner discussed with the patient otherwise, she should follow up with her dentist.At the time of discharge, the patient is alert, ambulatory, improved, tolerating po and understands instructions.Pt's evaluation, management, and disposition decisions were made in context of COVID pandemic. In this patient, the increased risk of nosocomial infection is of particular concern. ?In my judgment, the balance of clinical factors dictate the expedited evaluation and discharge from the ED, in the interest of this patient's health and safety.Patient progress: Evlyn Clines, MD RDMS FACEPAttending PhysicianDepartment of Emergency MedicineClinical Impressions as of Sep 26 2042 Dry tooth socket  ED DispositionDischarge Riley Lam, MD02/14/22 2044

## 2020-12-07 ENCOUNTER — Encounter: Admit: 2020-12-07 | Payer: PRIVATE HEALTH INSURANCE | Attending: Family Medicine

## 2020-12-07 DIAGNOSIS — Z1231 Encounter for screening mammogram for malignant neoplasm of breast: Secondary | ICD-10-CM

## 2020-12-07 DIAGNOSIS — R922 Inconclusive mammogram: Secondary | ICD-10-CM

## 2021-02-04 ENCOUNTER — Inpatient Hospital Stay: Admit: 2021-02-04 | Discharge: 2021-02-04 | Payer: MEDICARE

## 2021-02-04 ENCOUNTER — Telehealth: Admit: 2021-02-04 | Payer: PRIVATE HEALTH INSURANCE

## 2021-02-04 ENCOUNTER — Encounter: Admit: 2021-02-04 | Payer: PRIVATE HEALTH INSURANCE

## 2021-02-04 MED ORDER — ACETAMINOPHEN 325 MG TABLET
325 mg | Freq: Once | ORAL | Status: CP
Start: 2021-02-04 — End: ?
  Administered 2021-02-04: 15:00:00 325 mg via ORAL

## 2021-02-04 NOTE — Telephone Encounter
Outreach to [place]  to speak about recent ED Visit. Outreach attempt was unsuccessful. LVM. MyChart message sent.

## 2021-02-04 NOTE — ED Notes
1:06 PM Paged overhead and called in WR not located, left WR without being seen. Rowe Clack, RN

## 2024-01-17 ENCOUNTER — Inpatient Hospital Stay
Admit: 2024-01-17 | Discharge: 2024-01-17 | Payer: TRICARE (CHAMPUS) | Attending: Student in an Organized Health Care Education/Training Program

## 2024-01-17 ENCOUNTER — Emergency Department: Admit: 2024-01-17 | Payer: TRICARE (CHAMPUS) | Primary: Family Medicine

## 2024-01-17 DIAGNOSIS — Z886 Allergy status to analgesic agent status: Secondary | ICD-10-CM

## 2024-01-17 DIAGNOSIS — Z91018 Allergy to other foods: Secondary | ICD-10-CM

## 2024-01-17 DIAGNOSIS — Z881 Allergy status to other antibiotic agents status: Secondary | ICD-10-CM

## 2024-01-17 DIAGNOSIS — Z888 Allergy status to other drugs, medicaments and biological substances status: Secondary | ICD-10-CM

## 2024-01-17 DIAGNOSIS — Z8742 Personal history of other diseases of the female genital tract: Secondary | ICD-10-CM

## 2024-01-17 DIAGNOSIS — N939 Abnormal uterine and vaginal bleeding, unspecified: Secondary | ICD-10-CM

## 2024-01-17 DIAGNOSIS — Z880 Allergy status to penicillin: Secondary | ICD-10-CM

## 2024-01-17 DIAGNOSIS — Z720 Tobacco use: Secondary | ICD-10-CM

## 2024-01-17 DIAGNOSIS — Z9109 Other allergy status, other than to drugs and biological substances: Secondary | ICD-10-CM

## 2024-01-17 LAB — URINALYSIS WITH CULTURE REFLEX      (BH LMW YH)
BKR BILIRUBIN, UA: NEGATIVE
BKR GLUCOSE, UA: NEGATIVE
BKR LEUKOCYTE ESTERASE, UA: NEGATIVE
BKR NITRITE, UA: NEGATIVE
BKR PH, UA: 6 (ref 5.5–7.5)
BKR SPECIFIC GRAVITY, UA: 1.03 (ref 1.005–1.030)
BKR UROBILINOGEN, UA: <2 mg/dL (ref <=2.0)

## 2024-01-17 LAB — CBC WITH AUTO DIFFERENTIAL
BKR WAM ABSOLUTE IMMATURE GRANULOCYTES.: 0.07 x 1000/ÂµL (ref 0.00–0.30)
BKR WAM ABSOLUTE LYMPHOCYTE COUNT.: 3.06 x 1000/ÂµL (ref 0.60–3.70)
BKR WAM ABSOLUTE NRBC: 0 x 1000/ÂµL (ref 0.00–1.00)
BKR WAM ANC (ABSOLUTE NEUTROPHIL COUNT): 10.67 x 1000/ÂµL — ABNORMAL HIGH (ref 2.00–7.60)
BKR WAM BASOPHIL ABSOLUTE COUNT.: 0.04 x 1000/ÂµL (ref 0.00–1.00)
BKR WAM BASOPHILS: 0.3 % (ref 0.0–1.4)
BKR WAM EOSINOPHIL ABSOLUTE COUNT.: 0.18 x 1000/ÂµL (ref 0.00–1.00)
BKR WAM EOSINOPHILS: 1.2 % (ref 0.0–5.0)
BKR WAM HEMATOCRIT: 47.5 % — ABNORMAL HIGH (ref 35.00–45.00)
BKR WAM HEMOGLOBIN: 15.1 g/dL (ref 11.7–15.5)
BKR WAM IMMATURE GRANULOCYTES: 0.5 % (ref 0.0–1.0)
BKR WAM LYMPHOCYTES: 20.5 % (ref 17.0–50.0)
BKR WAM MCH: 26.4 pg — ABNORMAL LOW (ref 27.0–33.0)
BKR WAM MCHC: 31.8 g/dL (ref 31.0–36.0)
BKR WAM MCV: 82.9 fL (ref 80.0–100.0)
BKR WAM MONOCYTE ABSOLUTE COUNT.: 0.94 x 1000/ÂµL (ref 0.00–1.00)
BKR WAM MONOCYTES: 6.3 % (ref 4.0–12.0)
BKR WAM MPV: 9.2 fL (ref 8.0–12.0)
BKR WAM NEUTROPHILS: 71.2 % (ref 39.0–72.0)
BKR WAM NUCLEATED RED BLOOD CELLS: 0 % (ref 0.0–1.0)
BKR WAM PLATELETS: 465 x1000/ÂµL — ABNORMAL HIGH (ref 150–420)
BKR WAM RDW-CV: 14.7 % (ref 11.0–15.0)
BKR WAM RED BLOOD CELL COUNT.: 5.73 M/ÂµL (ref 4.00–6.00)
BKR WAM WHITE BLOOD CELL COUNT: 15 x1000/ÂµL — ABNORMAL HIGH (ref 4.0–11.0)

## 2024-01-17 LAB — COMPREHENSIVE METABOLIC PANEL
BKR A/G RATIO: 1.4 (ref 1.0–2.2)
BKR ALANINE AMINOTRANSFERASE (ALT): 55 U/L — ABNORMAL HIGH (ref 10–35)
BKR ALBUMIN: 4.6 g/dL (ref 3.6–5.1)
BKR ALKALINE PHOSPHATASE: 92 U/L (ref 9–122)
BKR ANION GAP: 15 (ref 7–17)
BKR ASPARTATE AMINOTRANSFERASE (AST): 35 U/L (ref 10–35)
BKR AST/ALT RATIO: 0.6
BKR BILIRUBIN TOTAL: 0.4 mg/dL (ref <=1.2)
BKR BLOOD UREA NITROGEN: 14 mg/dL (ref 6–20)
BKR BUN / CREAT RATIO: 21.5 (ref 8.0–23.0)
BKR CALCIUM: 9.3 mg/dL (ref 8.8–10.2)
BKR CHLORIDE: 105 mmol/L (ref 98–107)
BKR CO2: 18 mmol/L — ABNORMAL LOW (ref 20–30)
BKR CREATININE: 0.65 mg/dL (ref 0.40–1.30)
BKR EGFR, CREATININE (CKD-EPI 2021): >60 mL/min/{1.73_m2} (ref >=60)
BKR GLOBULIN: 3.4 g/dL (ref 2.0–3.9)
BKR GLUCOSE: 105 mg/dL — ABNORMAL HIGH (ref 70–100)
BKR POTASSIUM: 4.2 mmol/L (ref 3.3–5.3)
BKR PROTEIN TOTAL: 8 g/dL (ref 5.9–8.3)
BKR SODIUM: 138 mmol/L (ref 136–144)

## 2024-01-17 LAB — URINE MICROSCOPIC     (BH GH LMW YH)
BKR RBC/HPF, UA (INSTRUMENT): 38 /HPF — ABNORMAL HIGH (ref 0–2)
BKR URINE SQUAMOUS EPITHELIAL CELLS, UA (INSTRUMENT): 1 /HPF (ref 0–5)
BKR WBC/HPF, UA (INSTRUMENT): 1 /HPF (ref 0–5)

## 2024-01-17 LAB — UA REFLEX CULTURE

## 2024-01-17 MED ORDER — MORPHINE 4 MG/ML INTRAVENOUS SOLUTION
4 | Freq: Once | INTRAVENOUS | Status: CP
Start: 2024-01-17 — End: ?
  Administered 2024-01-17: 14:00:00 4 mL via INTRAVENOUS

## 2024-01-17 MED ORDER — TRANEXAMIC ACID 650 MG TABLET
650 | ORAL_TABLET | Freq: Three times a day (TID) | ORAL | 1 refills | 5.00000 days | Status: AC
Start: 2024-01-17 — End: 2024-01-17

## 2024-01-17 MED ORDER — TRANEXAMIC ACID 650 MG TABLET
650 | ORAL_TABLET | Freq: Three times a day (TID) | ORAL | 1 refills | 5.00000 days | Status: AC
Start: 2024-01-17 — End: ?

## 2024-01-17 MED ORDER — OXYCODONE IMMEDIATE RELEASE 5 MG TABLET
5 | ORAL_TABLET | ORAL | 1 refills | 2.00000 days | Status: AC | PRN
Start: 2024-01-17 — End: ?

## 2024-01-17 MED ORDER — OXYCODONE IMMEDIATE RELEASE 5 MG TABLET
5 | ORAL_TABLET | ORAL | 1 refills | 2.00000 days | Status: AC | PRN
Start: 2024-01-17 — End: 2024-01-17

## 2024-01-17 NOTE — ED Notes
 12:15 PM Pt arrives after PCP referral. Pt reports having continued bleeding since end of April. Pt was seen and had an US  that showed a uterine fibroid and was placed on provera. Pt was not improving with provera even after dose increase so her provider placed her on estrogen but she has not been able to take it yet. Pt reports 9/10 pelvic pain, no pain meds at home. Pt reports going through about 10 super tampons a day with dime sized clots. Pt denies CP, SOB, dizziness. Pt denies blood thinners. GCS 15. Pt has her social workers at bedside. Chief Complaint Patient presents with  Vaginal Bleeding   Pt states she has been having vaginal bleeding on her menstrual cycle since April, seen her doctor and had ultrasounds and told she has a uterine fibroid, placed on provera then dc'd and put on estrogen, went to [place] and they were unable to do ultrasound after a certain time and told her to come to [place] Past Medical History: Diagnosis Date  Asthma   Colitis   Hypertension  1416: pt asking to speak with MD about what pelvic exam showed, MD [phone]: Pt given copy of DC instructions and verbalizes understanding. Pt is ambulatory with a steady gait, pt breathing is even and unlabored at this time.

## 2024-01-17 NOTE — ED Provider Notes
 Chief Complaint Patient presents with  Vaginal Bleeding   Pt states she has been having vaginal bleeding on her menstrual cycle since April, seen her doctor and had ultrasounds and told she has a uterine fibroid, placed on provera then dc'd and put on estrogen, went to [place] and they were unable to do ultrasound after a certain time and told her to come to [facility] MDM  -----------------------------------------------------------------------Resident Note:Subjective:Pt is a 48 y.o. female w/ PMH of asthma, HTN, colitis, PTSD, MDD who presents with vaginal bleeding. Patient sent in by doctor. Pt reports having continued bleeding since end of April. Pt was seen and had an US  that showed a thickened uterine stripe and was placed on provera. Pt was not improving with provera even after dose increase so her provider placed her on estrogen but she has not been able to take it yet. Pt reports 9/10 pelvic pain, no pain meds at home. Pt reports going through about 10 super tampons a day with dime sized clots. No anti-coagulation.Was not able to booked OBGYN appt until July.Objective:BP (!) 144/103  - Pulse (!) 92  - Temp 98.4 ?F (36.9 ?C) (Oral)  - Resp 16  - SpO2 97% Gen: well-appearing, no acute distressCV: heart regular rate and rhythm, no murmur notedPulm: effort normal, no tachypnea, LCTABAbd: soft, non-distended, ttp in suprapubic region, no rebound or guardingSkin: warm, well-perfusedNeuro: no focal deficits Assessment and Plan: 48yo F presenting with AUB. DDX: fibroids vs polyps vs peri-menopause vs malignancy vs adenomyosis. Plan for OBGYN consult, labs, pregnancy test. ED Course:Patient's symptoms improved with morphine . OBGYN evaluated patient, recommended TXA TID x5 days 1300mg , cleared for discharge, follow up in one week.Independent interpretation: N/AI discussed this case with: OBGYN.Disposition: I considered Admission/Observation but the patient is stable for discharge. Return precautions extensively discussed, follow-up outpatient. This patient was presented to and discussed with Dr. Sharyn Deforest and a treatment plan and disposition were collaboratively agreed upon.Deedra Farr, MB BCh BAOEmergency Medicine, [place] PM-------------------------------------------------------------------------------------------------------------------------------Physical ExamED Triage Vitals [01/17/24 1156]BP: (!) 168/98Pulse: 85Pulse from  O2 sat: n/aResp: 16Temp: (!) 96.5 ?F (35.8 ?C)Temp src: TemporalSpO2: 100 % BP (!) 144/103  - Pulse (!) 92  - Temp 98.4 ?F (36.9 ?C) (Oral)  - Resp 16  - SpO2 97% Physical Exam ProceduresAttestation/Critical CareComments as of 01/17/24 2136 Fri Jan 17, 2024 1325 Spoke to gynecology, they will come evaluate. [BC] 1329 OBGYN requested TVUS, ordered. [BC] 1424 1300mg  TID x5 days of TXAFollow up with OBGYN within one weekDo not take OCPAppt booked with Dennard Fisher [BC] [phone] Attending Supervised: ResidentI saw and examined the patient. I agree with the findings and plan of care as documented in the resident's note except as noted below. Additional acute and/or chronic problems addressed: 48 year old with HTN, Asthma, PTSD, presenting with abnormal vaginal bleeding, previously with regular cycle- pt has daily vaginal bleeding since 11/29/2023, sometimes with mild spotting, however recently has increased- in the last few days, feels she is changing her tampon every hour. Pt was taking provera, without improvement. Pt also was also to start on estrogen. As PER pt she had official TVUS last week- no indication for repeat- no visualized masses/fibroids. Pt Hgb stable today, well appearing, vitals appropriate, seen by GYN recc TXA TID x5 days. Pt to stop OCP, and follow up with team here in 1 week. Pt given strict return precautions for any worsening of her symptoms and is agreeable with plan. Michalla Ringer [NK]  Comments User Index[BC] Deedra Farr, MD[NK] Lorice Roof, MD   Clinical  Impressions as of 01/17/24 2136 Abnormal uterine bleeding  ED DispositionDischarge  Deedra Farr, MDResident06/06/25 1504 Lorice Roof, MD06/06/25 2136

## 2024-01-17 NOTE — Discharge Instructions
 You were seen in the Emergency Department for vaginal bleeding.OBGYN will schedule an appointment for you within one week. As this complaint caused to you come to the Emergency Department you should follow up with your primary care doctor as soon as possible, ideally with in 24-72 hours to ensure your symptoms are improving and you have had no worsening or recurrence of your symptoms.If you have any new, worsening, or concerning symptoms, or anything else that is worrisome to you, you should return to the Emergency Department for re-evaluation.You can call 911 for any emergency. You can call 211 for essential community resources.

## 2024-01-21 NOTE — Other
 [facility]	 [facility]	GYN Consult Note Consult Information: Consultation requested by: No att. providers foundReason for consultation: abnormal uterine bleedingPresentation History: HPI: Kelsey Gibson is a 48 y.o. G0 with history of PCOS presenting with vaginal bleeding. States that vaginal bleeding began with her last menses on 11/29/23. Since then, she has not stopped bleeding. She reports saturating a super tampon every 30 minutes every day for the last two months. She was started on Provera 50mg  daily by her primary OBGYN at the [place] two weeks ago. She states that she took Provera every day for 2 weeks without improvement in her bleeding. She had a TVUS one week ago at the [place] which revealed a thickened endometrial lining of 1.4cm and did not reveal any structural abnormalities. Yesterday, her OBGYN at the [place] prescribed her an OCP which she has not yet started. In addition to the bleeding, she also reports intermittent abdominal pain which she states is not particularly bothersome. She denies nausea, vomiting, or changes to her bowel habits. She also denies fever, CP, SOB. She reports history of irregular periods and a diagnosis of PCOS but reports that this is the first time her menses have lasted this long.  PMH: asthma, HTN, PCOS, polycythemiaPSH: hemorrhoid, bunion surgeryMeds: amlodipine  Ob Hx: G0Gyn Hx : hx of PCOSShe is followed for ob/gyn care by [facility].Review of Allergies/Medical History/Medications: Allergies:  Allergies Allergen Reactions  Banana Hives  Ciprofloxacin Hives  Nsaids (Non-Steroidal Anti-Inflammatory Drug) Other (See Comments)   Tolerated meloxicam    Amoxicillin Rash   Pt unsure if this is true allergy.  Asa [Aspirin] Rash  Cat Dander Rash  Flexeril [Cyclobenzaprine] Rash Past Medical History: Past Medical History: Diagnosis Date  Asthma   Colitis Hypertension  Past Surgical History:Past Surgical History: Procedure Laterality Date  FOOT AMPUTATION    HEMORROIDECTOMY   Family History: No family history on file.Social History:Social History Socioeconomic History  Marital status: Single   Spouse name: Not on file  Number of children: Not on file  Years of education: Not on file  Highest education level: Not on file Occupational History  Not on file Tobacco Use  Smoking status: Every Day  Smokeless tobacco: Not on file Substance and Sexual Activity  Alcohol use: Not on file  Drug use: Not on file  Sexual activity: Not on file Other Topics Concern  Not on file Social History Narrative  Not on file Social Drivers of Health Financial Resource Strain: Not on file Food Insecurity: Not on file Transportation Needs: Not on file Physical Activity: Not on file Stress: Not on file Social Connections: Not on file Intimate Partner Violence: Not on file Housing Stability: Not on file Prior to Admission Medications: (Not in a hospital admission)Physical Exam: Vitals:Temp:  [96.5 ?F (35.8 ?C)-98.4 ?F (36.9 ?C)] 98.4 ?F (36.9 ?C)Pulse:  [85-92] 92Resp:  [16] 16BP: (144-168)/(98-103) 144/103SpO2:  [97 %-100 %] 97 %Intake/Output:No intake or output data in the 24 hours ending 01/17/24 1608Physical Exam:Gen- Well appearing, NADCV- WWPResp- Nl WOBAbd- soft, non-tender, non-distended, no guarding or reboundPelvic- NEFG, small amount of blood noted within vaginal canal, normal appearing cervix, no active bleeding noted on valsalvaExtremities- No edema b/l, no calf tenderness Neuro- Alert and orientedPatient exam or treatment required medical chaperone.The sensitive parts of the examination were performed with chaperone present: Yes; Chaperone Name, Role/Title: Raeford Bullion of Labs/Diagnostics: Lab Review:Recent Labs Lab 06/06/251255 WBC 15.0* HGB 15.1 HCT 47.50* PLT 465*  Recent Labs Lab 06/06/251255 NEUTROPHILS 71.2  Recent Labs Lab  06/06/251255 NA 138 K 4.2 CL 105 CO2 18* BUN 14 CREATININE 0.65 GLU 105*  Recent Labs Lab 06/06/251255 CALCIUM 9.3  Recent Labs Lab 06/06/251255 ALT 55* AST 35 ALKPHOS 92 BILITOT 0.4  No results for input(s): PTT, LABPROT, INR in the last 168 hours. Recent Labs Lab 06/06/251255 GLU 105*  No results for input(s): HCGQUANTIT in the last 168 hours.  Impression: Kelsey Gibson is a 48 y.o. G0 with hx of HTN, PCOS presenting with AUB. Pt HDS with minimal bleeding on exam and normal H/H. For her acute AUB, recommend TXA and discontinuiung OCP. Will arrange follow-up at [facility] for further workup of AUB, including endometrial sampling given her risk factors.  Recommendations: - Lysteda (TXA) 1300mg  TID x 5 days- Do not take OCP while on Lysteda as this may increase coagulation risk- Outpatient workup of AUBDiscussed with Dr. Sophie Dutch: Bailey Bolus Mallie Linnemann, MDObstetrics and Gynecology6/01/2024
